# Patient Record
Sex: Male | Born: 1965
Health system: Southern US, Community
[De-identification: ages and names within clinical notes are randomized; demographics above are authoritative.]

## PROBLEM LIST (undated history)

## (undated) DIAGNOSIS — I10 Essential (primary) hypertension: Secondary | ICD-10-CM

## (undated) DIAGNOSIS — E785 Hyperlipidemia, unspecified: Secondary | ICD-10-CM

## (undated) DIAGNOSIS — F0630 Mood disorder due to known physiological condition, unspecified: Secondary | ICD-10-CM

## (undated) DIAGNOSIS — M1990 Unspecified osteoarthritis, unspecified site: Secondary | ICD-10-CM

## (undated) DIAGNOSIS — E559 Vitamin D deficiency, unspecified: Secondary | ICD-10-CM

## (undated) DIAGNOSIS — F329 Major depressive disorder, single episode, unspecified: Secondary | ICD-10-CM

## (undated) DIAGNOSIS — F32A Depression, unspecified: Secondary | ICD-10-CM

## (undated) DIAGNOSIS — R5383 Other fatigue: Secondary | ICD-10-CM

## (undated) DIAGNOSIS — E663 Overweight: Secondary | ICD-10-CM

## (undated) DIAGNOSIS — E669 Obesity, unspecified: Secondary | ICD-10-CM

## (undated) HISTORY — PX: HAND SURGERY: SHX662

## (undated) HISTORY — DX: Major depressive disorder, single episode, unspecified: F32.9

## (undated) HISTORY — DX: Hyperlipidemia, unspecified: E78.5

## (undated) HISTORY — DX: Vitamin D deficiency, unspecified: E55.9

## (undated) HISTORY — DX: Obesity, unspecified: E66.9

## (undated) HISTORY — DX: Overweight: E66.3

## (undated) HISTORY — DX: Essential (primary) hypertension: I10

## (undated) HISTORY — DX: Mood disorder due to known physiological condition, unspecified: F06.30

## (undated) HISTORY — DX: Unspecified osteoarthritis, unspecified site: M19.90

## (undated) HISTORY — DX: Depression, unspecified: F32.A

## (undated) HISTORY — DX: Other fatigue: R53.83

---

## 1993-05-07 HISTORY — PX: KNEE SURGERY: SHX244

## 2001-10-28 ENCOUNTER — Ambulatory Visit (HOSPITAL_COMMUNITY): Admission: RE | Admit: 2001-10-28 | Discharge: 2001-10-28 | Payer: Self-pay | Admitting: Orthopedic Surgery

## 2001-10-28 ENCOUNTER — Encounter: Payer: Self-pay | Admitting: Orthopedic Surgery

## 2005-11-08 ENCOUNTER — Encounter: Admission: RE | Admit: 2005-11-08 | Discharge: 2005-11-08 | Payer: Self-pay | Admitting: Orthopedic Surgery

## 2006-02-07 ENCOUNTER — Ambulatory Visit (HOSPITAL_BASED_OUTPATIENT_CLINIC_OR_DEPARTMENT_OTHER): Admission: RE | Admit: 2006-02-07 | Discharge: 2006-02-07 | Payer: Self-pay | Admitting: Orthopedic Surgery

## 2006-03-25 ENCOUNTER — Encounter: Admission: RE | Admit: 2006-03-25 | Discharge: 2006-03-25 | Payer: Self-pay | Admitting: Orthopedic Surgery

## 2011-05-08 HISTORY — PX: BACK SURGERY: SHX140

## 2015-11-09 ENCOUNTER — Ambulatory Visit (INDEPENDENT_AMBULATORY_CARE_PROVIDER_SITE_OTHER): Payer: BLUE CROSS/BLUE SHIELD | Admitting: Family Medicine

## 2015-11-09 ENCOUNTER — Encounter: Payer: Self-pay | Admitting: Family Medicine

## 2015-11-09 VITALS — BP 145/90 | HR 87 | Ht 69.25 in | Wt 198.9 lb

## 2015-11-09 DIAGNOSIS — R5383 Other fatigue: Secondary | ICD-10-CM

## 2015-11-09 DIAGNOSIS — R29898 Other symptoms and signs involving the musculoskeletal system: Secondary | ICD-10-CM | POA: Insufficient documentation

## 2015-11-09 DIAGNOSIS — E785 Hyperlipidemia, unspecified: Secondary | ICD-10-CM | POA: Insufficient documentation

## 2015-11-09 DIAGNOSIS — F39 Unspecified mood [affective] disorder: Secondary | ICD-10-CM | POA: Insufficient documentation

## 2015-11-09 DIAGNOSIS — E559 Vitamin D deficiency, unspecified: Secondary | ICD-10-CM

## 2015-11-09 DIAGNOSIS — F0630 Mood disorder due to known physiological condition, unspecified: Secondary | ICD-10-CM

## 2015-11-09 DIAGNOSIS — E663 Overweight: Secondary | ICD-10-CM

## 2015-11-09 DIAGNOSIS — I10 Essential (primary) hypertension: Secondary | ICD-10-CM

## 2015-11-09 DIAGNOSIS — E669 Obesity, unspecified: Secondary | ICD-10-CM

## 2015-11-09 DIAGNOSIS — M159 Polyosteoarthritis, unspecified: Secondary | ICD-10-CM | POA: Insufficient documentation

## 2015-11-09 DIAGNOSIS — M948X8 Other specified disorders of cartilage, other site: Secondary | ICD-10-CM

## 2015-11-09 HISTORY — DX: Overweight: E66.3

## 2015-11-09 HISTORY — DX: Hyperlipidemia, unspecified: E78.5

## 2015-11-09 HISTORY — DX: Obesity, unspecified: E66.9

## 2015-11-09 HISTORY — DX: Other fatigue: R53.83

## 2015-11-09 HISTORY — DX: Mood disorder due to known physiological condition, unspecified: F06.30

## 2015-11-09 HISTORY — DX: Vitamin D deficiency, unspecified: E55.9

## 2015-11-09 HISTORY — DX: Essential (primary) hypertension: I10

## 2015-11-09 MED ORDER — ATORVASTATIN CALCIUM 20 MG PO TABS: 20.0000 mg | ORAL_TABLET | Freq: Every day | ORAL | Status: DC

## 2015-11-09 MED ORDER — MELOXICAM 15 MG PO TABS: 15.0000 mg | ORAL_TABLET | Freq: Every day | ORAL | Status: DC

## 2015-11-09 MED ORDER — LOSARTAN POTASSIUM-HCTZ 100-12.5 MG PO TABS: 1.0000 | ORAL_TABLET | Freq: Every day | ORAL | Status: DC

## 2015-11-09 MED ORDER — SERTRALINE HCL 50 MG PO TABS: 50.0000 mg | ORAL_TABLET | Freq: Every day | ORAL | Status: DC

## 2015-11-09 NOTE — Assessment & Plan Note (Signed)
Counseling done 

## 2015-11-09 NOTE — Assessment & Plan Note (Signed)
Explained to pt that a prominent xiphoid is completely within normal limits.  Reassured patient.

## 2015-11-09 NOTE — Assessment & Plan Note (Signed)
Patient uses Mobic in the past and tolerated it well.  It worked very well for his multiple joint pains.  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  He desires refill.

## 2015-11-09 NOTE — Assessment & Plan Note (Signed)
Patient just had coffee with cream today but pt lives farther away and desires bld work today.  Prudent diet.  Discussed with patient.  Continue medications.

## 2015-11-09 NOTE — Assessment & Plan Note (Signed)
Patient understands not quite at goal of regularly under 140/90.  Advised weight loss, prudent diet, regular exercise.  Take meds daily and patient has been out of them for about 2 weeks or more, so hence I asked patient to monitor his blood pressure when he goes back on the meds.

## 2015-11-09 NOTE — Assessment & Plan Note (Signed)
Patient has not been taking his vitamin D supplements as instructed in the past to do so.  We will obtain blood work today.

## 2015-11-09 NOTE — Patient Instructions (Addendum)
Monitor BP at home and if remains above our goal of 140/90 or less--> RTC sooner than planned.    Read the tips for quitting smoking; when you're ready we can consider having you start the Chantix that you are to have at home.       Smoking Cessation, Tips for Success If you are ready to quit smoking, congratulations! You have chosen to help yourself be healthier. Cigarettes bring nicotine, tar, carbon monoxide, and other irritants into your body. Your lungs, heart, and blood vessels will be able to work better without these poisons. There are many different ways to quit smoking. Nicotine gum, nicotine patches, a nicotine inhaler, or nicotine nasal spray can help with physical craving. Hypnosis, support groups, and medicines help break the habit of smoking. WHAT THINGS CAN I DO TO MAKE QUITTING EASIER?  Here are some tips to help you quit for good:  Pick a date when you will quit smoking completely. Tell all of your friends and family about your plan to quit on that date.  Do not try to slowly cut down on the number of cigarettes you are smoking. Pick a quit date and quit smoking completely starting on that day.  Throw away all cigarettes.   Clean and remove all ashtrays from your home, work, and car.  On a card, write down your reasons for quitting. Carry the card with you and read it when you get the urge to smoke.  Cleanse your body of nicotine. Drink enough water and fluids to keep your urine clear or pale yellow. Do this after quitting to flush the nicotine from your body.  Learn to predict your moods. Do not let a bad situation be your excuse to have a cigarette. Some situations in your life might tempt you into wanting a cigarette.  Never have "just one" cigarette. It leads to wanting another and another. Remind yourself of your decision to quit.  Change habits associated with smoking. If you smoked while driving or when feeling stressed, try other activities to replace smoking.  Stand up when drinking your coffee. Brush your teeth after eating. Sit in a different chair when you read the paper. Avoid alcohol while trying to quit, and try to drink fewer caffeinated beverages. Alcohol and caffeine may urge you to smoke.  Avoid foods and drinks that can trigger a desire to smoke, such as sugary or spicy foods and alcohol.  Ask people who smoke not to smoke around you.  Have something planned to do right after eating or having a cup of coffee. For example, plan to take a walk or exercise.  Try a relaxation exercise to calm you down and decrease your stress. Remember, you may be tense and nervous for the first 2 weeks after you quit, but this will pass.  Find new activities to keep your hands busy. Play with a pen, coin, or rubber band. Doodle or draw things on paper.  Brush your teeth right after eating. This will help cut down on the craving for the taste of tobacco after meals. You can also try mouthwash.   Use oral substitutes in place of cigarettes. Try using lemon drops, carrots, cinnamon sticks, or chewing gum. Keep them handy so they are available when you have the urge to smoke.  When you have the urge to smoke, try deep breathing.  Designate your home as a nonsmoking area.  If you are a heavy smoker, ask your health care provider about a prescription for nicotine  chewing gum. It can ease your withdrawal from nicotine.  Reward yourself. Set aside the cigarette money you save and buy yourself something nice.  Look for support from others. Join a support group or smoking cessation program. Ask someone at home or at work to help you with your plan to quit smoking.  Always ask yourself, "Do I need this cigarette or is this just a reflex?" Tell yourself, "Today, I choose not to smoke," or "I do not want to smoke." You are reminding yourself of your decision to quit.  Do not replace cigarette smoking with electronic cigarettes (commonly called e-cigarettes). The  safety of e-cigarettes is unknown, and some may contain harmful chemicals.  If you relapse, do not give up! Plan ahead and think about what you will do the next time you get the urge to smoke. HOW WILL I FEEL WHEN I QUIT SMOKING? You may have symptoms of withdrawal because your body is used to nicotine (the addictive substance in cigarettes). You may crave cigarettes, be irritable, feel very hungry, cough often, get headaches, or have difficulty concentrating. The withdrawal symptoms are only temporary. They are strongest when you first quit but will go away within 10-14 days. When withdrawal symptoms occur, stay in control. Think about your reasons for quitting. Remind yourself that these are signs that your body is healing and getting used to being without cigarettes. Remember that withdrawal symptoms are easier to treat than the major diseases that smoking can cause.  Even after the withdrawal is over, expect periodic urges to smoke. However, these cravings are generally short lived and will go away whether you smoke or not. Do not smoke! WHAT RESOURCES ARE AVAILABLE TO HELP ME QUIT SMOKING? Your health care provider can direct you to community resources or hospitals for support, which may include:  Group support.  Education.  Hypnosis.  Therapy.   This information is not intended to replace advice given to you by your health care provider. Make sure you discuss any questions you have with your health care provider.   Document Released: 01/20/2004 Document Revised: 05/14/2014 Document Reviewed: 10/09/2012 Elsevier Interactive Patient Education 2016 [employer].         Steps to Quit Smoking  Smoking tobacco can be harmful to your health and can affect almost every organ in your body. Smoking puts you, and those around you, at risk for developing many serious chronic diseases. Quitting smoking is difficult, but it is one of the best things that you can do for your health. It is  never too late to quit. WHAT ARE THE BENEFITS OF QUITTING SMOKING? When you quit smoking, you lower your risk of developing serious diseases and conditions, such as:  Lung cancer or lung disease, such as COPD.  Heart disease.  Stroke.  Heart attack.  Infertility.  Osteoporosis and bone fractures. Additionally, symptoms such as coughing, wheezing, and shortness of breath may get better when you quit. You may also find that you get sick less often because your body is stronger at fighting off colds and infections. If you are pregnant, quitting smoking can help to reduce your chances of having a baby of low birth weight. HOW DO I GET READY TO QUIT? When you decide to quit smoking, create a plan to make sure that you are successful. Before you quit:  Pick a date to quit. Set a date within the next two weeks to give you time to prepare.  Write down the reasons why you are quitting.  Keep this list in places where you will see it often, such as on your bathroom mirror or in your car or wallet.  Identify the people, places, things, and activities that make you want to smoke (triggers) and avoid them. Make sure to take these actions:  Throw away all cigarettes at home, at work, and in your car.  Throw away smoking accessories, such as Scientist, research (medical).  Clean your car and make sure to empty the ashtray.  Clean your home, including curtains and carpets.  Tell your family, friends, and coworkers that you are quitting. Support from your loved ones can make quitting easier.  Talk with your health care provider about your options for quitting smoking.  Find out what treatment options are covered by your health insurance. WHAT STRATEGIES CAN I USE TO QUIT SMOKING?  Talk with your healthcare provider about different strategies to quit smoking. Some strategies include:  Quitting smoking altogether instead of gradually lessening how much you smoke over a period of time. Research shows  that quitting "cold [place]" is more successful than gradually quitting.  Attending in-person counseling to help you build problem-solving skills. You are more likely to have success in quitting if you attend several counseling sessions. Even short sessions of 10 minutes can be effective.  Finding resources and support systems that can help you to quit smoking and remain smoke-free after you quit. These resources are most helpful when you use them often. They can include:  Online chats with a Social worker.  Telephone quitlines.  Printed Furniture conservator/restorer.  Support groups or group counseling.  Text messaging programs.  Mobile phone applications.  Taking medicines to help you quit smoking. (If you are pregnant or breastfeeding, talk with your health care provider first.) Some medicines contain nicotine and some do not. Both types of medicines help with cravings, but the medicines that include nicotine help to relieve withdrawal symptoms. Your health care provider may recommend:  Nicotine patches, gum, or lozenges.  Nicotine inhalers or sprays.  Non-nicotine medicine that is taken by mouth. Talk with your health care provider about combining strategies, such as taking medicines while you are also receiving in-person counseling. Using these two strategies together makes you more likely to succeed in quitting than if you used either strategy on its own. If you are pregnant or breastfeeding, talk with your health care provider about finding counseling or other support strategies to quit smoking. Do not take medicine to help you quit smoking unless told to do so by your health care provider. WHAT THINGS CAN I DO TO MAKE IT EASIER TO QUIT? Quitting smoking might feel overwhelming at first, but there is a lot that you can do to make it easier. Take these important actions:  Reach out to your family and friends and ask that they support and encourage you during this time. Call telephone quitlines,  reach out to support groups, or work with a counselor for support.  Ask people who smoke to avoid smoking around you.  Avoid places that trigger you to smoke, such as bars, parties, or smoke-break areas at work.  Spend time around people who do not smoke.  Lessen stress in your life, because stress can be a smoking trigger for some people. To lessen stress, try:  Exercising regularly.  Deep-breathing exercises.  Yoga.  Meditating.  Performing a body scan. This involves closing your eyes, scanning your body from head to toe, and noticing which parts of your body are particularly tense. Purposefully relax  the muscles in those areas.  Download or purchase mobile phone or tablet apps (applications) that can help you stick to your quit plan by providing reminders, tips, and encouragement. There are many free apps, such as QuitGuide from the [employer] developer for Disease Control and Prevention). You can find other support for quitting smoking (smoking cessation) through smokefree.gov and other websites. HOW WILL I FEEL WHEN I QUIT SMOKING? Within the first 24 hours of quitting smoking, you may start to feel some withdrawal symptoms. These symptoms are usually most noticeable 2-3 days after quitting, but they usually do not last beyond 2-3 weeks. Changes or symptoms that you might experience include:  Mood swings.  Restlessness, anxiety, or irritation.  Difficulty concentrating.  Dizziness.  Strong cravings for sugary foods in addition to nicotine.  Mild weight gain.  Constipation.  Nausea.  Coughing or a sore throat.  Changes in how your medicines work in your body.  A depressed mood.  Difficulty sleeping (insomnia). After the first 2-3 weeks of quitting, you may start to notice more positive results, such as:  Improved sense of smell and taste.  Decreased coughing and sore throat.  Slower heart rate.  Lower blood pressure.  Clearer skin.  The ability to breathe more  easily.  Fewer sick days. Quitting smoking is very challenging for most people. Do not get discouraged if you are not successful the first time. Some people need to make many attempts to quit before they achieve long-term success. Do your best to stick to your quit plan, and talk with your health care provider if you have any questions or concerns.   This information is not intended to replace advice given to you by your health care provider. Make sure you discuss any questions you have with your health care provider.   Document Released: 04/17/2001 Document Revised: 09/07/2014 Document Reviewed: 09/07/2014 Elsevier Interactive Patient Education [employer]            Why follow it? Research shows. . Those who follow the Mediterranean diet have a reduced risk of heart disease  . The diet is associated with a reduced incidence of Parkinson's and Alzheimer's diseases . People following the diet may have longer life expectancies and lower rates of chronic diseases  . The Dietary Guidelines for Americans recommends the Mediterranean diet as an eating plan to promote health and prevent disease  What Is the Mediterranean Diet?  . Healthy eating plan based on typical foods and recipes of Mediterranean-style cooking . The diet is primarily a plant based diet; these foods should make up a majority of meals   Starches - Plant based foods should make up a majority of meals - They are an important sources of vitamins, minerals, energy, antioxidants, and fiber - Choose whole grains, foods high in fiber and minimally processed items  - Typical grain sources include wheat, oats, barley, corn, brown rice, bulgar, farro, millet, polenta, couscous  - Various types of beans include chickpeas, lentils, fava beans, black beans, white beans   Fruits  Veggies - Large quantities of antioxidant rich fruits & veggies; 6 or more servings  - Vegetables can be eaten raw or lightly drizzled with oil and  cooked  - Vegetables common to the traditional Mediterranean Diet include: artichokes, arugula, beets, broccoli, brussel sprouts, cabbage, carrots, celery, collard greens, cucumbers, eggplant, kale, leeks, lemons, lettuce, mushrooms, okra, onions, peas, peppers, potatoes, pumpkin, radishes, rutabaga, shallots, spinach, sweet potatoes, turnips, zucchini - Fruits common to the Mediterranean Diet  include: apples, apricots, avocados, cherries, clementines, dates, figs, grapefruits, grapes, melons, nectarines, oranges, peaches, pears, pomegranates, strawberries, tangerines  Fats - Replace butter and margarine with healthy oils, such as olive oil, canola oil, and tahini  - Limit nuts to no more than a handful a day  - Nuts include walnuts, almonds, pecans, pistachios, pine nuts  - Limit or avoid candied, honey roasted or heavily salted nuts - Olives are central to the [employer] - can be eaten whole or used in a variety of dishes   Meats Protein - Limiting red meat: no more than a few times a month - When eating red meat: choose lean cuts and keep the portion to the size of deck of cards - Eggs: approx. 0 to 4 times a week  - Fish and lean poultry: at least 2 a week  - Healthy protein sources include, chicken, [place], lean beef, lamb - Increase intake of seafood such as tuna, salmon, trout, mackerel, shrimp, scallops - Avoid or limit high fat processed meats such as sausage and bacon  Dairy - Include moderate amounts of low fat dairy products  - Focus on healthy dairy such as fat free yogurt, skim milk, low or reduced fat cheese - Limit dairy products higher in fat such as whole or 2% milk, cheese, ice cream  Alcohol - Moderate amounts of red wine is ok  - No more than 5 oz daily for women (all ages) and men older than age 54  - No more than 10 oz of wine daily for men younger than 89  Other - Limit sweets and other desserts  - Use herbs and spices instead of salt to flavor foods  - Herbs  and spices common to the traditional Mediterranean Diet include: basil, bay leaves, chives, cloves, cumin, fennel, garlic, lavender, marjoram, mint, oregano, parsley, pepper, rosemary, sage, savory, sumac, tarragon, thyme   It's not just a diet, it's a lifestyle:  . The Mediterranean diet includes lifestyle factors typical of those in the region  . Foods, drinks and meals are best eaten with others and savored . Daily physical activity is important for overall good health . This could be strenuous exercise like running and aerobics . This could also be more leisurely activities such as walking, housework, yard-work, or taking the stairs . Moderation is the key; a balanced and healthy diet accommodates most foods and drinks . Consider portion sizes and frequency of consumption of certain foods   Meal Ideas & Options:  . Breakfast:  o Whole wheat toast or whole wheat English muffins with peanut butter & hard boiled egg o Steel cut oats topped with apples & cinnamon and skim milk  o Fresh fruit: banana, strawberries, melon, berries, peaches  o Smoothies: strawberries, bananas, greek yogurt, peanut butter o Low fat greek yogurt with blueberries and granola  o Egg white omelet with spinach and mushrooms o Breakfast couscous: whole wheat couscous, apricots, skim milk, cranberries  . Sandwiches:  o Hummus and grilled vegetables (peppers, zucchini, squash) on whole wheat bread   o Grilled chicken on whole wheat pita with lettuce, tomatoes, cucumbers or tzatziki  o Tuna salad on whole wheat bread: tuna salad made with greek yogurt, olives, red peppers, capers, green onions o Garlic rosemary lamb pita: lamb sauted with garlic, rosemary, salt & pepper; add lettuce, cucumber, greek yogurt to pita - flavor with lemon juice and black pepper  . Seafood:  o Mediterranean grilled salmon, seasoned with garlic, basil, parsley, lemon  juice and black pepper o Shrimp, lemon, and spinach whole-grain pasta salad  made with low fat greek yogurt  o Seared scallops with lemon orzo  o Seared tuna steaks seasoned salt, pepper, coriander topped with tomato mixture of olives, tomatoes, olive oil, minced garlic, parsley, green onions and cappers  . Meats:  o Herbed greek chicken salad with kalamata olives, cucumber, feta  o Red bell peppers stuffed with spinach, bulgur, lean ground beef (or lentils) & topped with feta   o Kebabs: skewers of chicken, tomatoes, onions, zucchini, squash  o [place] burgers: made with red onions, mint, dill, lemon juice, feta cheese topped with roasted red peppers . Vegetarian o Cucumber salad: cucumbers, artichoke hearts, celery, red onion, feta cheese, tossed in olive oil & lemon juice  o Hummus and whole grain pita points with a greek salad (lettuce, tomato, feta, olives, cucumbers, red onion) o Lentil soup with celery, carrots made with vegetable broth, garlic, salt and pepper  o Tabouli salad: parsley, bulgur, mint, scallions, cucumbers, tomato, radishes, lemon juice, olive oil, salt and pepper.            DASH Eating Plan DASH stands for "Dietary Approaches to Stop Hypertension." The DASH eating plan is a healthy eating plan that has been shown to reduce high blood pressure (hypertension). Additional health benefits may include reducing the risk of type 2 diabetes mellitus, heart disease, and stroke. The DASH eating plan may also help with weight loss. WHAT DO I NEED TO KNOW ABOUT THE DASH EATING PLAN? For the DASH eating plan, you will follow these general guidelines:  Choose foods with a percent daily value for sodium of less than 5% (as listed on the food label).  Use salt-free seasonings or herbs instead of table salt or sea salt.  Check with your health care provider or pharmacist before using salt substitutes.  Eat lower-sodium products, often labeled as "lower sodium" or "no salt added."  Eat fresh foods.  Eat more vegetables, fruits, and low-fat dairy  products.  Choose whole grains. Look for the word "whole" as the first word in the ingredient list.  Choose fish and skinless chicken or [place] more often than red meat. Limit fish, poultry, and meat to 6 oz (170 g) each day.  Limit sweets, desserts, sugars, and sugary drinks.  Choose heart-healthy fats.  Limit cheese to 1 oz (28 g) per day.  Eat more home-cooked food and less restaurant, buffet, and fast food.  Limit fried foods.  Cook foods using methods other than frying.  Limit canned vegetables. If you do use them, rinse them well to decrease the sodium.  When eating at a restaurant, ask that your food be prepared with less salt, or no salt if possible. WHAT FOODS CAN I EAT? Seek help from a dietitian for individual calorie needs. Grains Whole grain or whole wheat bread. Brown rice. Whole grain or whole wheat pasta. Quinoa, bulgur, and whole grain cereals. Low-sodium cereals. Corn or whole wheat flour tortillas. Whole grain cornbread. Whole grain crackers. Low-sodium crackers. Vegetables Fresh or frozen vegetables (raw, steamed, roasted, or grilled). Low-sodium or reduced-sodium tomato and vegetable juices. Low-sodium or reduced-sodium tomato sauce and paste. Low-sodium or reduced-sodium canned vegetables.  Fruits All fresh, canned (in natural juice), or frozen fruits. Meat and Other Protein Products Ground beef (85% or leaner), grass-fed beef, or beef trimmed of fat. Skinless chicken or [place]. Ground chicken or [place]. Pork trimmed of fat. All fish and seafood. Eggs. Dried beans, peas, or  lentils. Unsalted nuts and seeds. Unsalted canned beans. Dairy Low-fat dairy products, such as skim or 1% milk, 2% or reduced-fat cheeses, low-fat ricotta or cottage cheese, or plain low-fat yogurt. Low-sodium or reduced-sodium cheeses. Fats and Oils Tub margarines without trans fats. Light or reduced-fat mayonnaise and salad dressings (reduced sodium). Avocado. Safflower, olive, or canola  oils. Natural peanut or almond butter. Other Unsalted popcorn and pretzels. The items listed above may not be a complete list of recommended foods or beverages. Contact your dietitian for more options. WHAT FOODS ARE NOT RECOMMENDED? Grains White bread. White pasta. White rice. Refined cornbread. Bagels and croissants. Crackers that contain trans fat. Vegetables Creamed or fried vegetables. Vegetables in a cheese sauce. Regular canned vegetables. Regular canned tomato sauce and paste. Regular tomato and vegetable juices. Fruits Dried fruits. Canned fruit in light or heavy syrup. Fruit juice. Meat and Other Protein Products Fatty cuts of meat. Ribs, chicken wings, bacon, sausage, bologna, salami, chitterlings, fatback, hot dogs, bratwurst, and packaged luncheon meats. Salted nuts and seeds. Canned beans with salt. Dairy Whole or 2% milk, cream, half-and-half, and cream cheese. Whole-fat or sweetened yogurt. Full-fat cheeses or blue cheese. Nondairy creamers and whipped toppings. Processed cheese, cheese spreads, or cheese curds. Condiments Onion and garlic salt, seasoned salt, table salt, and sea salt. Canned and packaged gravies. Worcestershire sauce. Tartar sauce. Barbecue sauce. Teriyaki sauce. Soy sauce, including reduced sodium. Steak sauce. Fish sauce. Oyster sauce. Cocktail sauce. Horseradish. Ketchup and mustard. Meat flavorings and tenderizers. Bouillon cubes. Hot sauce. Tabasco sauce. Marinades. Taco seasonings. Relishes. Fats and Oils Butter, stick margarine, lard, shortening, ghee, and bacon fat. Coconut, palm kernel, or palm oils. Regular salad dressings. Other Pickles and olives. Salted popcorn and pretzels. The items listed above may not be a complete list of foods and beverages to avoid. Contact your dietitian for more information. WHERE CAN I FIND MORE INFORMATION? National Heart, Lung, and Blood Institute: CableProm[employer].itwww.nhlbi.nih.gov/health/health-topics/topics/dash/   This  information is not intended to replace advice given to you by your health care provider. Make sure you discuss any questions you have with your health care provider.   Document Released: 04/12/2011 Document Revised: 05/14/2014 Document Reviewed: 02/25/2013 Elsevier Interactive Patient Education [employer] Inc.

## 2015-11-09 NOTE — Assessment & Plan Note (Signed)
Patient thinks he is more fatigued than usual due to the increased stress levels in his life lately.  We will obtain blood work though.

## 2015-11-09 NOTE — Assessment & Plan Note (Signed)
Patient is at the heaviest he has been most of his life.  He is not happy and wishes to lose weight.  We discussed 1 pound weight loss goal per week.  Cut back on portions and move more.

## 2015-11-09 NOTE — Progress Notes (Signed)
Aaron Cooper, D.O. [facility]   Subjective:    Chief Complaint  Patient presents with  . Establish Care   New pt, here to establish care.   HPI: Aaron Cooper is a pleasant 50 y.o. male who [facility] to establish care  Mood:  Been under more stress lately.  Recently he had to move into his father-in-law's house so he and his wife couldn't take care of him.  This is been stressful on the patient as they left their home and all of their goods and personal items in their home and left to their son and daughter.  Thinks he has done well since abruptly stopping his well be a trend approximately a month ago when he ran out.  No suicidal or homicidal ideation.  Does complain of decreased libido on the sertraline alone.  This is not bothersome enough to patient to stop or alter his treatment course.  He has no problems with ejaculation or orgasm.  One mo--> ran out of BP meds, his wellbutrin and mobic.  Ha snot been monitoring it at home like he is suppose to.    No exercise and no prudent diet lately  He also complains of a protuberance at the base of his sternum.  Never noticed it before, but noticed it recently.  No pain or discomfort.  No shortness of breath or difficulty breathing.  No exercise intolerance.  Still smoking.  Had quit in the past on his Wellbutrin but went back to smoking.  We discussed in detail various ways that he can quit and patient wishes to discuss it with his wife and get back to us.  Still pre-contemplative phases.     Past Medical History  Diagnosis Date  . Hyperlipidemia   . Hypertension   . Depression   . Arthritis       Past Surgical History  Procedure Laterality Date  . Hand surgery Right   . Knee surgery Right   . Back surgery        Family History  Problem Relation Age of Onset  . Diabetes Mother   . Hyperlipidemia Mother   . Heart attack Father   .  Hyperlipidemia Father   . Hypertension Father   . Cancer Sister     breast  . Stroke Maternal Aunt   . Alcohol abuse Maternal Grandmother   . Heart attack Brother   . Alcohol abuse Brother       History  Drug Use No  ,    History  Alcohol Use No  ,    History  Smoking status  . Current Every Day Smoker -- 1.50 packs/day  Smokeless tobacco  . Never Used  ,     History  Sexual Activity  . Sexual Activity: Yes      Patient's Medications  New Prescriptions   No medications on file  Previous Medications   ATORVASTATIN (LIPITOR) 20 MG TABLET    Take 20 mg by mouth daily.   HYDROCHLOROTHIAZIDE (HYDRODIURIL) 25 MG TABLET    Take 25 mg by mouth daily.   LOSARTAN (COZAAR) 100 MG TABLET    Take 100 mg by mouth daily.   SERTRALINE (ZOLOFT) 50 MG TABLET    Take 50 mg by mouth daily.  Modified Medications   No medications on file  Discontinued Medications   LOSARTAN (COZAAR) 50 MG TABLET  Take 50 mg by mouth daily.     Review of patient's allergies indicates no known allergies. Outpatient Encounter Prescriptions as of 11/09/2015  Medication Sig  . atorvastatin (LIPITOR) 20 MG tablet Take 20 mg by mouth daily.  Marland Kitchen losartan (COZAAR) 100 MG tablet Take 100 mg by mouth daily.  . sertraline (ZOLOFT) 50 MG tablet Take 50 mg by mouth daily.  . [DISCONTINUED] losartan (COZAAR) 50 MG tablet Take 50 mg by mouth daily.  . hydrochlorothiazide (HYDRODIURIL) 25 MG tablet Take 25 mg by mouth daily.   No facility-administered encounter medications on file as of 11/09/2015.      There are no preventive care reminders to display for this patient.    There is no immunization history on file for this patient.   <no information>   Fall Risk  11/09/2015 11/09/2015  Falls in the past year? No No     Depression screen [facility] 2/9 11/09/2015 11/09/2015  Decreased Interest 0 0  Down, Depressed, Hopeless 0 0  PHQ - 2 Score 0 0      Review of Systems:   ( Completed via Adult Medical  History Intake form today ) General:   Denies fever, chills, appetite changes, unexplained weight loss.  Optho/Auditory:   Denies visual changes, blurred vision/LOV, ringing in ears/ diff hearing Respiratory:   Denies SOB, DOE, cough, wheezing.  Cardiovascular:   Denies chest pain, palpitations, new onset peripheral edema  Gastrointestinal:   Denies nausea, vomiting, diarrhea.  Genitourinary:    Denies dysuria, increased frequency, flank pain.  Endocrine:     Denies hot or cold intolerance, polyuria, polydipsia. Musculoskeletal:  Denies unexplained myalgias, joint swelling, arthralgias, gait problems.  Skin:  Denies rash, suspicious lesions or new/ changes in moles Neurological:    Denies dizziness, syncope, unexplained weakness, lightheadedness, numbness  Psychiatric/Behavioral:   Denies mood changes, suicidal or homicidal ideations, hallucinations    Objective:   Blood pressure 145/90, pulse 87, height 5' 9.25" (1.759 m), weight 198 lb 14.4 oz (90.22 kg). Body mass index is 29.16 kg/(m^2).  General: Well Developed, well nourished, and in no acute distress.  Neuro: Alert and oriented x3, extra-ocular muscles intact, sensation grossly intact.  HEENT: Normocephalic, atraumatic, pupils equal round reactive to light, neck supple, no gross masses, no carotid bruits, no JVD apprec Skin: no gross suspicious lesions or rashes  Cardiac: Regular rate and rhythm, no murmurs rubs or gallops.  Respiratory: Essentially clear to auscultation bilaterally. Not using accessory muscles, speaking in full sentences.  Chest: Palpation of a more prominent xiphoid process is nontender.  No crepitus. Abdominal: Soft, not grossly distended Musculoskeletal: Ambulates w/o diff, FROM * 4 ext.  Vasc: less 2 sec cap RF, warm and pink  Psych:  No HI/SI, judgement and insight good.    Impression and Recommendations:    The patient was counselled, risk factors were discussed, anticipatory guidance  given.  Please see AVS handed out to patient at the end of our visit for further patient instructions/ counseling done pertaining to today's office visit.  Mood disorder in conditions classified elsewhere Pt has not been taking his wellbutrin for last one month or so.  Has been doing well on his sertraline-feels much calmer than usual.  We will continue that and dictated to monitor.  Encouraged healthier lifestyles of prudent diet and regular exercise.  HLD (hyperlipidemia) Patient just had coffee with cream today but pt lives farther away and desires bld work today.  Prudent diet.  Discussed with patient.  Continue medications.  HTN (hypertension) Patient understands not quite at goal of regularly under 140/90.  Advised weight loss, prudent diet, regular exercise.  Take meds daily and patient has been out of them for about 2 weeks or more, so hence I asked patient to monitor his blood pressure when he goes back on the meds.  Obesity (BMI 30-39.9) Patient is at the heaviest he has been most of his life.  He is not happy and wishes to lose weight.  We discussed 1 pound weight loss goal per week.  Cut back on portions and move more.  Vitamin D insufficiency Patient has not been taking his vitamin D supplements as instructed in the past to do so.  We will obtain blood work today.  Fatigue Patient thinks he is more fatigued than usual due to the increased stress levels in his life lately.  We will obtain blood work though.  Overweight (BMI 25.0-29.9) Counseling done  Generalized osteoarthritis of multiple sites Patient uses Mobic in the past and tolerated it well.  It worked very well for his multiple joint pains.  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  He desires refill.  Xiphoid prominence Explained to pt that a prominent xiphoid is completely within normal limits.  Reassured patient.   Gross side effects, risk and benefits, and alternatives of  medications discussed with patient.  Patient is aware that all medications have potential side effects and we are unable to predict every sideeffect or drug-drug interaction that may occur.  Expresses verbal understanding and consents to current therapy plan and treatment regiment.  Note: This document was prepared using Dragon voice recognition software and may include unintentional dictation errors.

## 2015-11-09 NOTE — Assessment & Plan Note (Addendum)
Pt has not been taking his wellbutrin for last one month or so.  Has been doing well on his sertraline-feels much calmer than usual.  We will continue that and dictated to monitor.  Encouraged healthier lifestyles of prudent diet and regular exercise.

## 2015-11-10 ENCOUNTER — Telehealth: Payer: Self-pay

## 2015-11-10 ENCOUNTER — Other Ambulatory Visit: Payer: Self-pay | Admitting: Family Medicine

## 2015-11-10 LAB — CBC
HEMATOCRIT: 47.9 % (ref 38.5–50.0)
HEMOGLOBIN: 16.3 g/dL (ref 13.2–17.1)
MCH: 30.9 pg (ref 27.0–33.0)
MCHC: 34 g/dL (ref 32.0–36.0)
MCV: 90.9 fL (ref 80.0–100.0)
MPV: 12.6 fL — ABNORMAL HIGH (ref 7.5–12.5)
Platelets: 235 10*3/uL (ref 140–400)
RBC: 5.27 MIL/uL (ref 4.20–5.80)
RDW: 13.9 % (ref 11.0–15.0)
WBC: 7.2 10*3/uL (ref 3.8–10.8)

## 2015-11-10 LAB — COMPREHENSIVE METABOLIC PANEL
ALBUMIN: 4.4 g/dL (ref 3.6–5.1)
ALK PHOS: 105 U/L (ref 40–115)
ALT: 15 U/L (ref 9–46)
AST: 15 U/L (ref 10–35)
BILIRUBIN TOTAL: 0.4 mg/dL (ref 0.2–1.2)
BUN: 16 mg/dL (ref 7–25)
CALCIUM: 9.3 mg/dL (ref 8.6–10.3)
CO2: 22 mmol/L (ref 20–31)
Chloride: 103 mmol/L (ref 98–110)
Creat: 0.72 mg/dL (ref 0.70–1.33)
GLUCOSE: 98 mg/dL (ref 65–99)
POTASSIUM: 4.7 mmol/L (ref 3.5–5.3)
Sodium: 140 mmol/L (ref 135–146)
Total Protein: 7.2 g/dL (ref 6.1–8.1)

## 2015-11-10 LAB — TSH: TSH: 0.8 mIU/L (ref 0.40–4.50)

## 2015-11-10 LAB — HEMOGLOBIN A1C
Hgb A1c MFr Bld: 5.6 % (ref <5.7)
MEAN PLASMA GLUCOSE: 114 mg/dL

## 2015-11-10 LAB — LIPID PANEL
CHOL/HDL RATIO: 6.4 ratio — AB (ref <=5.0)
CHOLESTEROL: 210 mg/dL — AB (ref 125–200)
HDL: 33 mg/dL — ABNORMAL LOW (ref >=40)
LDL Cholesterol: 124 mg/dL (ref <130)
TRIGLYCERIDES: 265 mg/dL — AB (ref <150)
VLDL: 53 mg/dL — AB (ref <30)

## 2015-11-10 LAB — VITAMIN D 25 HYDROXY (VIT D DEFICIENCY, FRACTURES): VIT D 25 HYDROXY: 22 ng/mL — AB (ref 30–100)

## 2015-11-10 LAB — VITAMIN B12: Vitamin B-12: 383 pg/mL (ref 200–1100)

## 2015-11-10 MED ORDER — ATORVASTATIN CALCIUM 80 MG PO TABS: ORAL_TABLET | ORAL | Status: DC

## 2015-11-10 MED ORDER — VITAMIN D (ERGOCALCIFEROL) 1.25 MG (50000 UNIT) PO CAPS: ORAL_CAPSULE | ORAL | Status: DC

## 2015-11-10 MED ORDER — NIACIN ER (ANTIHYPERLIPIDEMIC) 1000 MG PO TBCR: EXTENDED_RELEASE_TABLET | ORAL | Status: DC

## 2015-11-10 MED ORDER — NIACIN ER 500 MG PO CPCR: 500.0000 mg | ORAL_CAPSULE | Freq: Every day | ORAL | Status: DC

## 2015-11-10 NOTE — Telephone Encounter (Signed)
Pharmacy called requesting RX for Niacin 500mg  tablets rather than cutting a 1000mg  tablet in half.  RX sent to pharmacy.  Tiajuana Amass. Dmya Long, CMA

## 2016-02-14 ENCOUNTER — Ambulatory Visit (INDEPENDENT_AMBULATORY_CARE_PROVIDER_SITE_OTHER): Payer: BLUE CROSS/BLUE SHIELD | Admitting: Family Medicine

## 2016-02-14 ENCOUNTER — Encounter: Payer: Self-pay | Admitting: Gastroenterology

## 2016-02-14 ENCOUNTER — Encounter: Payer: Self-pay | Admitting: Family Medicine

## 2016-02-14 VITALS — BP 102/68 | HR 68 | Temp 97.7°F | Ht 69.25 in | Wt 199.0 lb

## 2016-02-14 DIAGNOSIS — I10 Essential (primary) hypertension: Secondary | ICD-10-CM

## 2016-02-14 DIAGNOSIS — Z23 Encounter for immunization: Secondary | ICD-10-CM

## 2016-02-14 DIAGNOSIS — F4323 Adjustment disorder with mixed anxiety and depressed mood: Secondary | ICD-10-CM | POA: Insufficient documentation

## 2016-02-14 DIAGNOSIS — Z1211 Encounter for screening for malignant neoplasm of colon: Secondary | ICD-10-CM

## 2016-02-14 DIAGNOSIS — M159 Polyosteoarthritis, unspecified: Secondary | ICD-10-CM

## 2016-02-14 DIAGNOSIS — F0630 Mood disorder due to known physiological condition, unspecified: Secondary | ICD-10-CM

## 2016-02-14 DIAGNOSIS — E663 Overweight: Secondary | ICD-10-CM

## 2016-02-14 DIAGNOSIS — E559 Vitamin D deficiency, unspecified: Secondary | ICD-10-CM

## 2016-02-14 MED ORDER — MELOXICAM 15 MG PO TABS: 15.0000 mg | ORAL_TABLET | Freq: Every day | ORAL | 1 refills | Status: DC

## 2016-02-14 MED ORDER — LAMOTRIGINE 25 MG PO CHEW: CHEWABLE_TABLET | ORAL | 0 refills | Status: DC

## 2016-02-14 NOTE — Progress Notes (Signed)
Impression and Recommendations:    1. Adjustment disorder with mixed anxiety and depressed mood   2. Mood disorder in conditions classified elsewhere   3. Screening for colon cancer   4. Essential hypertension   5. Vitamin D insufficiency   6. Overweight (BMI 25.0-29.9)   7. Need for prophylactic vaccination and inoculation against influenza   8. Generalized osteoarthritis of multiple sites   Pt was in the office today for 40+ minutes, with over 50% time spent in face to face counseling of various medical concerns and in coordination of care   Trial of Lamictal after discussion of the risks and benefits with patient and other alternatives. Son was on it with good results and affect.  Continue sertraline as he feels this works well for him  Referral for colonoscopy sent  Blood pressure well controlled and patient asymptomatic-continue current medications. Ambulatory monitoring. Diet and lifestyle modifications  Continue the vitamin D supplementation  Reassured patient he is not losing weight and in fact he is still in the overweight range with a BMI of over 29.  Encouraged healthy diet and exercise in hopes to gain muscle mass and lose weight  Flu vaccine given  Meloxicam refill given. Patient tolerated well and it works well   - Reminded patient he will need yearly physical with me in addition to these office visits to manage his chronic diseases.  Orders Placed This Encounter  Procedures  . Flu Vaccine QUAD 36+ mos IM  . Ambulatory referral to Gastroenterology    New Prescriptions   LAMOTRIGINE (LAMICTAL) 25 MG CHEW CHEWABLE TABLET    Blue convenience pack  (ODT) components;  blue 5 weeks starter pack contains 25 mg ODT 21 and 50 mg ODT 7    Modified Medications   Modified Medication Previous Medication   MELOXICAM (MOBIC) 15 MG TABLET meloxicam (MOBIC) 15 MG tablet      Take 1 tablet (15 mg total) by mouth daily.    Take 1 tablet (15 mg total) by mouth daily.     Discontinued Medications   NIACIN (NIASPAN) 1000 MG CR TABLET    Take 1/2 tablet nightly for 4 weeks then increase to 1 tab b-4 bedtime    The patient was counseled, risk factors were discussed, anticipatory guidance given.  Gross side effects, risk and benefits, and alternatives of medications and treatment plan in general discussed with patient.  Patient is aware that all medications have potential side effects and we are unable to predict every side effect or drug-drug interaction that may occur.   Patient will call with any questions prior to using medication if they have concerns.  Expresses verbal understanding and consents to current therapy and treatment regimen.  No barriers to understanding were identified.  Red flag symptoms and signs discussed in detail.  Patient expressed understanding regarding what to do in case of emergency\urgent symptoms   FOLLOW UP:  Return in about 4 weeks (around 03/13/2016) for Recheck mood and starting Lamictal, recheck LFT.    Please see AVS handed out to patient at the end of our visit for further patient instructions/ counseling done pertaining to today's office visit.    Note: This document was prepared using Dragon voice recognition software and may include unintentional dictation errors.   -------------------------------------------------------------------------------------------------------------------------------------------------------------------------------------------------------------------------------------------- Subjective:    CC:  Chief Complaint  Patient presents with  . Hypertension  . Hyperlipidemia    HPI: Aaron Cooper is a 50 y.o. male who presents to  [place] [facility] today for issues as discussed below.   Hypertension-Even though blood pressures a little low today- patient feels great.  No dizziness, tiredness etc.  In the past several years his blood pressure usually runs on the low normal  side and he is completely asymptomatic.     Mood: He is on the sertraline that we put them on in the past. He is doing okay but still having outbursts of anger- about 3-4 times a day at work where he feels a bit out-of-control and it is causing interpersonal strains with his coworkers. Patient is not happy and feels like he needs some kind of a mood stabilizer to keep him more on a even keel.  He does have a family history of bipolar- and in his younger son.   It is finally hitting him, and he is coming to the realization that he left his whole house and everything he owns to his children and all just to move in with his father in law with his wife. This is emotionally distressing to him but he feels he needs to support his wife.   Hyperlipidemia- Tolerated the Niaspan well that we added last office visit as well as the increase in Lipitor.    He is trying to drink more water. Still not the best diet- a lot of fast foods. No exercise at all   He is also concerned that he has lost "a bunch of weight in the past couple of months ".   His wife has been on him a lot about it.   Patient has not been changing his diet or activity level. He is worried something is wrong.    Review of the chart shows that he has actually gained 1 pound in 3+ months since last seen. Patient was shocked, yet relieved by this.   Wt Readings from Last 3 Encounters:  02/14/16 199 lb (90.3 kg)  11/09/15 198 lb 14.4 oz (90.2 kg)   BP Readings from Last 3 Encounters:  02/14/16 102/68  11/09/15 (!) 145/90   Pulse Readings from Last 3 Encounters:  02/14/16 68  11/09/15 87   BMI Readings from Last 3 Encounters:  02/14/16 29.18 kg/m  11/09/15 29.16 kg/m     Patient Care Team    Relationship Specialty Notifications Start End  Thomasene Lot, DO PCP - General Family Medicine  11/09/15      Patient Active Problem List   Diagnosis Date Noted  . Adjustment disorder with mixed anxiety and depressed mood 02/14/2016    . HTN (hypertension) 11/09/2015  . Mood disorder in conditions classified elsewhere 11/09/2015  . HLD (hyperlipidemia) 11/09/2015  . Vitamin D insufficiency 11/09/2015  . Fatigue 11/09/2015  . Overweight (BMI 25.0-29.9) 11/09/2015  . Generalized osteoarthritis of multiple sites 11/09/2015  . Xiphoid prominence 11/09/2015    Current Outpatient Prescriptions on File Prior to Visit  Medication Sig Dispense Refill  . atorvastatin (LIPITOR) 80 MG tablet Take 80 mg nightly before bedtime. 90 tablet 3  . losartan-hydrochlorothiazide (HYZAAR) 100-12.5 MG tablet Take 1 tablet by mouth daily. 90 tablet 1  . sertraline (ZOLOFT) 50 MG tablet Take 1 tablet (50 mg total) by mouth daily. 90 tablet 1  . Vitamin D, Ergocalciferol, (DRISDOL) 50000 units CAPS capsule Take one tablet wkly 12 capsule 10  . niacin 500 MG CR capsule Take 1 capsule (500 mg total) by mouth at bedtime. (Patient not taking: Reported on 02/14/2016) 28 capsule 0   No  current facility-administered medications on file prior to visit.     Past Medical history, Surgical history, Family history, Social history, Allergies and Medications have been entered into the medical record, reviewed and changed as needed.   Allergies:  No Known Allergies  Review of Systems  Constitutional: Negative.  Negative for chills, diaphoresis, fever, malaise/fatigue and weight loss.  HENT: Negative.  Negative for congestion, sore throat and tinnitus.   Eyes: Negative.  Negative for blurred vision, double vision and photophobia.  Respiratory: Negative.  Negative for cough and wheezing.   Cardiovascular: Negative.  Negative for chest pain and palpitations.  Gastrointestinal: Negative.  Negative for blood in stool, diarrhea, nausea and vomiting.  Genitourinary: Negative.  Negative for dysuria, frequency and urgency.  Musculoskeletal: Positive for back pain and joint pain. Negative for myalgias.       Bilateral knee pain  Skin: Negative.  Negative for  itching and rash.  Neurological: Negative.  Negative for dizziness, focal weakness, weakness and headaches.  Endo/Heme/Allergies: Negative.  Negative for environmental allergies and polydipsia. Does not bruise/bleed easily.  Psychiatric/Behavioral: Negative.  Negative for depression and memory loss. The patient is not nervous/anxious and does not have insomnia.      Objective:   Blood pressure 102/68, pulse 68, temperature 97.7 F (36.5 C), temperature source Oral, height 5' 9.25" (1.759 m), weight 199 lb (90.3 kg). Body mass index is 29.18 kg/m. General: Well Developed, well nourished, appropriate for stated age.  Neuro: Alert and oriented x3, extra-ocular muscles intact, sensation grossly intact.  HEENT: Normocephalic, atraumatic, neck supple,  no carotid bruits, no JVD  Skin: Warm and dry, no gross rash. Cardiac: RRR, S1 S2,  no murmurs rubs or gallops.  Respiratory: ECTA B/L, Not using accessory muscles, speaking in full sentences-unlabored. Vascular:  No gross lower ext edema, cap RF less 2 sec. Psych: No HI/SI, judgement and insight good, Euthymic mood. Full Affect.

## 2016-02-14 NOTE — Patient Instructions (Signed)
  Adjustment Disorder Adjustment disorder is an unusually severe reaction to a stressful life event, such as the loss of a job or physical illness. The event may be any stressful event other than the loss of a loved one. Adjustment disorder may affect your feelings, your thinking, how you act, or a combination of these. It may interfere with personal relationships or with the way you are at work, school, or home. People with this disorder are at risk for suicide and substance abuse. They may develop a more serious mental disorder, such as major depressive disorder or post-traumatic stress disorder. SIGNS AND SYMPTOMS  Symptoms may include:  Sadness, depressed mood, or crying spells.  Loss of enjoyment.  Change in appetite or weight.  Sense of loss or hopelessness.  Thoughts of suicide.  Anxiety, worry, or nervousness.  Trouble sleeping.  Avoiding family and friends.  Poor school performance.  Fighting or vandalism.  Reckless driving.  Skipping school.  Poor work performance.  Ignoring bills. Symptoms of adjustment disorder start within 3 months of the stressful life event. They do not last more than 6 months after the event has ended. DIAGNOSIS  To make a diagnosis, your health care provider will ask about what has happened in your life and how it has affected you. He or she may also ask about your medical history and use of medicines, alcohol, and other substances. Your health care provider may do a physical exam and order lab tests or other studies. You may be referred to a mental health specialist for evaluation. TREATMENT  Treatment options include:  Counseling or talk therapy. Talk therapy is usually provided by mental health specialists.  Medicine. Certain medicines may help with depression, anxiety, and sleep.  Support groups. Support groups offer emotional support, advice, and guidance. They are made up of people who have had similar experiences. HOME CARE  INSTRUCTIONS  Keep all follow-up visits as directed by your health care provider. This is important.  Take medicines only as directed by your health care provider. SEEK MEDICAL CARE IF:  Your symptoms get worse.  SEEK IMMEDIATE MEDICAL CARE IF: You have serious thoughts about hurting yourself or someone else. MAKE SURE YOU:  Understand these instructions.  Will watch your condition.  Will get help right away if you are not doing well or get worse.   This information is not intended to replace advice given to you by your health care provider. Make sure you discuss any questions you have with your health care provider.   Document Released: 12/26/2005 Document Revised: 05/14/2014 Document Reviewed: 09/15/2013 Elsevier Interactive Patient Education 2016 [employer]  

## 2016-03-20 ENCOUNTER — Ambulatory Visit (INDEPENDENT_AMBULATORY_CARE_PROVIDER_SITE_OTHER): Payer: BLUE CROSS/BLUE SHIELD | Admitting: Family Medicine

## 2016-03-20 ENCOUNTER — Encounter: Payer: Self-pay | Admitting: Family Medicine

## 2016-03-20 VITALS — BP 110/71 | HR 63 | Ht 69.25 in | Wt 206.0 lb

## 2016-03-20 DIAGNOSIS — F0630 Mood disorder due to known physiological condition, unspecified: Secondary | ICD-10-CM

## 2016-03-20 DIAGNOSIS — E559 Vitamin D deficiency, unspecified: Secondary | ICD-10-CM | POA: Diagnosis not present

## 2016-03-20 DIAGNOSIS — I10 Essential (primary) hypertension: Secondary | ICD-10-CM | POA: Diagnosis not present

## 2016-03-20 DIAGNOSIS — E782 Mixed hyperlipidemia: Secondary | ICD-10-CM

## 2016-03-20 DIAGNOSIS — F4323 Adjustment disorder with mixed anxiety and depressed mood: Secondary | ICD-10-CM | POA: Diagnosis not present

## 2016-03-20 DIAGNOSIS — E669 Obesity, unspecified: Secondary | ICD-10-CM

## 2016-03-20 MED ORDER — LAMOTRIGINE 100 MG PO TABS: 50.0000 mg | ORAL_TABLET | Freq: Two times a day (BID) | ORAL | 1 refills | Status: DC

## 2016-03-20 NOTE — Assessment & Plan Note (Signed)
Counseling done- advised wt loss.  Discussed with patient importance of weight loss to help achieve health goals and how increasing weight, correlates to increasing risk of disease. (or increasing risk of not controlling existing diseases.) 

## 2016-03-20 NOTE — Assessment & Plan Note (Deleted)
Continue lamictal and zoloft - current dose. 

## 2016-03-20 NOTE — Patient Instructions (Signed)
Please if the 50 mg of lamotrigine once daily works fine, just continue that. No need to go to twice a day if the 1 time daily is working well.

## 2016-03-20 NOTE — Progress Notes (Signed)
Impression and Recommendations:    1. Adjustment disorder with mixed anxiety and depressed mood   2. Essential hypertension   3. Mixed hyperlipidemia   4. Vitamin D insufficiency   5. Mood disorder in conditions classified elsewhere   6. Obesity (BMI 30-39.9)     HLD (hyperlipidemia) Check CMP, no complaints, cont niacin and lipitor  Mood disorder in conditions classified elsewhere Continue lamictal and zoloft - current dose.  Obesity (BMI 30-39.9) Counseling done- advised wt loss.  Discussed with patient importance of weight loss to help achieve health goals and how increasing weight, correlates to increasing risk of disease. (or increasing risk of not controlling existing diseases.)  HTN (hypertension) Well controlled - cont meds, check CMP   New Prescriptions   LAMOTRIGINE (LAMICTAL) 100 MG TABLET    Take 0.5 tablets (50 mg total) by mouth 2 (two) times daily.    Modified Medications   No medications on file    Discontinued Medications   LAMOTRIGINE (LAMICTAL) 25 MG CHEW CHEWABLE TABLET    Blue convenience pack  (ODT) components;  blue 5 weeks starter pack contains 25 mg ODT 21 and 50 mg ODT 7    The patient was counseled, risk factors were discussed, anticipatory guidance given.  Gross side effects, risk and benefits, and alternatives of medications and treatment plan in general discussed with patient.  Patient is aware that all medications have potential side effects and we are unable to predict every side effect or drug-drug interaction that may occur.   Patient will call with any questions prior to using medication if they have concerns.  Expresses verbal understanding and consents to current therapy and treatment regimen.  No barriers to understanding were identified.  Red flag symptoms and signs discussed in detail.  Patient expressed understanding regarding what to do in case of emergency\urgent symptoms  Return in about 4 months (around 07/18/2016) for  Follow-up of current medical issues.  Please see AVS handed out to patient at the end of our visit for further patient instructions/ counseling done pertaining to today's office visit.    Note: This document was prepared using Dragon voice recognition software and may include unintentional dictation errors.   --------------------------------------------------------------------------------------------------------------------------------------------------------------------------------------------------------------------------------------------    Subjective:    CC: No chief complaint on file.   HPI: Aaron Cooper is a 50 y.o. male who [facility] today for issues as discussed below.    mood- much more even kneel since starting the lamictal last OV on 02-14-16.   Doesn't feel like he wants to "rip people's heads off".   Much calmer with family / wife  Still smoking, not ready to quit though.    Went to GI for c/s re: colonoscopy.  Scheduled for early Dec.      Wt Readings from Last 3 Encounters:  03/20/16 206 lb (93.4 kg)  02/14/16 199 lb (90.3 kg)  11/09/15 198 lb 14.4 oz (90.2 kg)   BP Readings from Last 3 Encounters:  03/20/16 110/71  02/14/16 102/68  11/09/15 (!) 145/90   Pulse Readings from Last 3 Encounters:  03/20/16 63  02/14/16 68  11/09/15 87   BMI Readings from Last 3 Encounters:  03/20/16 30.20 kg/m  02/14/16 29.18 kg/m  11/09/15 29.16 kg/m     Patient Care Team    Relationship Specialty Notifications Start End  Thomasene Loteborah Joevanni Roddey, DO PCP - General Family Medicine  11/09/15     Patient Active Problem  List   Diagnosis Date Noted  . Adjustment disorder with mixed anxiety and depressed mood 02/14/2016  . HTN (hypertension) 11/09/2015  . Mood disorder in conditions classified elsewhere 11/09/2015  . HLD (hyperlipidemia) 11/09/2015  . Vitamin D insufficiency 11/09/2015  . Fatigue 11/09/2015  . Generalized  osteoarthritis of multiple sites 11/09/2015  . 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 Obesity (BMI 30-39.9) 11/09/2015    Past Medical history, Surgical history, Family history, Social history, Allergies and Medications have been entered into the medical record, reviewed and changed as needed.   Allergies:  No Known Allergies  Review of Systems  Constitutional: Negative.  Negative for chills, diaphoresis, fever, malaise/fatigue and weight loss.  HENT: Negative.  Negative for congestion, sore throat and tinnitus.   Eyes: Negative.  Negative for blurred vision, double vision and photophobia.  Respiratory: Negative.  Negative for cough and wheezing.   Cardiovascular: Negative.  Negative for chest pain and palpitations.  Gastrointestinal: Negative.  Negative for blood in stool, diarrhea, nausea and vomiting.  Genitourinary: Negative.  Negative for dysuria, frequency and urgency.  Musculoskeletal: Negative.  Negative for joint pain and myalgias.  Skin: Negative.  Negative for itching and rash.  Neurological: Negative.  Negative for dizziness, focal weakness, weakness and headaches.  Endo/Heme/Allergies: Negative.  Negative for environmental allergies and polydipsia. Does not bruise/bleed easily.  Psychiatric/Behavioral: Negative.  Negative for depression and memory loss. The patient is not nervous/anxious and does not have insomnia.      Objective:   Blood pressure 110/71, pulse 63, height 5' 9.25" (1.759 m), weight 206 lb (93.4 kg). Body mass index is 30.2 kg/m. General: Well Developed, well nourished, appropriate for stated age.  Neuro: Alert and oriented x3, extra-ocular muscles intact, sensation grossly intact.  HEENT: Normocephalic, atraumatic, neck supple   Skin: Warm and dry, no gross rash. Cardiac: RRR, S1 S2,  no murmurs rubs or gallops.  Respiratory: ECTA B/L, Not using accessory muscles, speaking in full sentences-unlabored. Vascular:  No gross lower ext edema, cap RF less 2  sec. Psych: No HI/SI, judgement and insight good, Euthymic mood. Full Affect.

## 2016-03-20 NOTE — Assessment & Plan Note (Signed)
Continue lamictal and zoloft - current dose.

## 2016-03-20 NOTE — Assessment & Plan Note (Signed)
Check CMP, no complaints, cont niacin and lipitor

## 2016-03-20 NOTE — Assessment & Plan Note (Signed)
Well controlled - cont meds, check CMP

## 2016-03-21 LAB — LIPID PANEL
CHOL/HDL RATIO: 4.2 ratio (ref <5.0)
CHOLESTEROL: 156 mg/dL (ref <200)
HDL: 37 mg/dL — ABNORMAL LOW (ref >40)
LDL Cholesterol: 85 mg/dL (ref <100)
TRIGLYCERIDES: 170 mg/dL — AB (ref <150)
VLDL: 34 mg/dL — AB (ref <30)

## 2016-03-21 LAB — COMPLETE METABOLIC PANEL WITH GFR
ALBUMIN: 4.4 g/dL (ref 3.6–5.1)
ALK PHOS: 93 U/L (ref 40–115)
ALT: 21 U/L (ref 9–46)
AST: 18 U/L (ref 10–35)
BUN: 18 mg/dL (ref 7–25)
CALCIUM: 9.3 mg/dL (ref 8.6–10.3)
CHLORIDE: 100 mmol/L (ref 98–110)
CO2: 29 mmol/L (ref 20–31)
Creat: 0.84 mg/dL (ref 0.70–1.33)
GFR, Est Non African American: >89 mL/min (ref >=60)
Glucose, Bld: 86 mg/dL (ref 65–99)
POTASSIUM: 4.3 mmol/L (ref 3.5–5.3)
Sodium: 137 mmol/L (ref 135–146)
Total Bilirubin: 0.6 mg/dL (ref 0.2–1.2)
Total Protein: 7 g/dL (ref 6.1–8.1)

## 2016-04-16 ENCOUNTER — Encounter: Payer: Self-pay | Admitting: Gastroenterology

## 2016-05-03 ENCOUNTER — Encounter: Payer: Self-pay | Admitting: Family Medicine

## 2016-05-15 ENCOUNTER — Other Ambulatory Visit: Payer: Self-pay

## 2016-05-15 MED ORDER — LOSARTAN POTASSIUM-HCTZ 100-12.5 MG PO TABS: 1.0000 | ORAL_TABLET | Freq: Every day | ORAL | 1 refills | Status: DC

## 2016-10-03 ENCOUNTER — Other Ambulatory Visit: Payer: Self-pay

## 2016-10-03 MED ORDER — LAMOTRIGINE 100 MG PO TABS: 50.0000 mg | ORAL_TABLET | Freq: Two times a day (BID) | ORAL | 0 refills | Status: DC

## 2016-10-03 MED ORDER — MELOXICAM 15 MG PO TABS: 15.0000 mg | ORAL_TABLET | Freq: Every day | ORAL | 0 refills | Status: DC

## 2016-10-03 NOTE — Telephone Encounter (Signed)
Lamotrigine and meloxicam refilled.  Patient needs appointment for further refills.

## 2016-11-12 ENCOUNTER — Other Ambulatory Visit: Payer: Self-pay

## 2016-11-12 DIAGNOSIS — F4323 Adjustment disorder with mixed anxiety and depressed mood: Secondary | ICD-10-CM

## 2016-11-12 DIAGNOSIS — F0630 Mood disorder due to known physiological condition, unspecified: Secondary | ICD-10-CM

## 2016-11-12 MED ORDER — LAMOTRIGINE 100 MG PO TABS: 50.0000 mg | ORAL_TABLET | Freq: Two times a day (BID) | ORAL | 0 refills | Status: DC

## 2016-11-12 NOTE — Telephone Encounter (Signed)
Pharmacy sent over refill request for Lamotrigine - per note on last refill patient needs follow up.  Patient has not made that follow up appointment.  Refilled for 15 days patient will need to make appointment for follow up. MPulliam, CMA/RT(R)

## 2016-11-20 ENCOUNTER — Encounter: Payer: Self-pay | Admitting: Family Medicine

## 2016-11-20 ENCOUNTER — Ambulatory Visit: Payer: BLUE CROSS/BLUE SHIELD | Admitting: Family Medicine

## 2016-11-20 VITALS — BP 126/77 | HR 77 | Ht 69.25 in | Wt 203.0 lb

## 2016-11-20 DIAGNOSIS — E559 Vitamin D deficiency, unspecified: Secondary | ICD-10-CM

## 2016-11-20 DIAGNOSIS — Z716 Tobacco abuse counseling: Secondary | ICD-10-CM

## 2016-11-20 DIAGNOSIS — M159 Polyosteoarthritis, unspecified: Secondary | ICD-10-CM

## 2016-11-20 DIAGNOSIS — F0630 Mood disorder due to known physiological condition, unspecified: Secondary | ICD-10-CM

## 2016-11-20 DIAGNOSIS — R5383 Other fatigue: Secondary | ICD-10-CM

## 2016-11-20 DIAGNOSIS — E781 Pure hyperglyceridemia: Secondary | ICD-10-CM

## 2016-11-20 DIAGNOSIS — Z9229 Personal history of other drug therapy: Secondary | ICD-10-CM

## 2016-11-20 DIAGNOSIS — E782 Mixed hyperlipidemia: Secondary | ICD-10-CM

## 2016-11-20 DIAGNOSIS — Z114 Encounter for screening for human immunodeficiency virus [HIV]: Secondary | ICD-10-CM

## 2016-11-20 DIAGNOSIS — Z720 Tobacco use: Secondary | ICD-10-CM

## 2016-11-20 DIAGNOSIS — F4323 Adjustment disorder with mixed anxiety and depressed mood: Secondary | ICD-10-CM

## 2016-11-20 DIAGNOSIS — I10 Essential (primary) hypertension: Secondary | ICD-10-CM

## 2016-11-20 DIAGNOSIS — Z789 Other specified health status: Secondary | ICD-10-CM

## 2016-11-20 MED ORDER — MELOXICAM 15 MG PO TABS: 15.0000 mg | ORAL_TABLET | Freq: Every day | ORAL | 0 refills | Status: DC

## 2016-11-20 MED ORDER — LAMOTRIGINE 100 MG PO TABS: 50.0000 mg | ORAL_TABLET | Freq: Two times a day (BID) | ORAL | 0 refills | Status: DC

## 2016-11-20 MED ORDER — NIACIN ER 500 MG PO CPCR: 500.0000 mg | ORAL_CAPSULE | Freq: Two times a day (BID) | ORAL | 1 refills | Status: DC

## 2016-11-20 MED ORDER — LOSARTAN POTASSIUM-HCTZ 100-12.5 MG PO TABS: 1.0000 | ORAL_TABLET | Freq: Every day | ORAL | 1 refills | Status: DC

## 2016-11-20 MED ORDER — NIACIN ER 500 MG PO CPCR: 500.0000 mg | ORAL_CAPSULE | Freq: Every day | ORAL | 0 refills | Status: DC

## 2016-11-20 MED ORDER — ATORVASTATIN CALCIUM 80 MG PO TABS: ORAL_TABLET | ORAL | 3 refills | Status: DC

## 2016-11-20 MED ORDER — SERTRALINE HCL 50 MG PO TABS: 50.0000 mg | ORAL_TABLET | Freq: Every day | ORAL | 1 refills | Status: DC

## 2016-11-20 MED ORDER — MELOXICAM 15 MG PO TABS: 15.0000 mg | ORAL_TABLET | Freq: Every day | ORAL | 3 refills | Status: DC

## 2016-11-20 NOTE — Progress Notes (Signed)
Impression and Recommendations:    1. Essential hypertension   2. Generalized osteoarthritis of multiple sites   3. Tetanus toxoid vaccination administered greater than 10 years ago   4. Screening for HIV (human immunodeficiency virus)   5. Tobacco abuse counseling   6. Tobacco abuse   7. Mixed hyperlipidemia   8. Mood disorder in conditions classified elsewhere   9. Adjustment disorder with mixed anxiety and depressed mood   10. Vitamin D insufficiency   11. Other fatigue   12. Hypertriglyceridemia     No problem-specific Assessment & Plan notes found for this encounter.   New Prescriptions   No medications on file    Modified Medications   Modified Medication Previous Medication   ATORVASTATIN (LIPITOR) 80 MG TABLET atorvastatin (LIPITOR) 80 MG tablet      Take 80 mg nightly before bedtime.    Take 80 mg nightly before bedtime.   LAMOTRIGINE (LAMICTAL) 100 MG TABLET lamoTRIgine (LAMICTAL) 100 MG tablet      Take 0.5 tablets (50 mg total) by mouth 2 (two) times daily.    Take 0.5 tablets (50 mg total) by mouth 2 (two) times daily.   LOSARTAN-HYDROCHLOROTHIAZIDE (HYZAAR) 100-12.5 MG TABLET losartan-hydrochlorothiazide (HYZAAR) 100-12.5 MG tablet      Take 1 tablet by mouth daily.    Take 1 tablet by mouth daily.   MELOXICAM (MOBIC) 15 MG TABLET meloxicam (MOBIC) 15 MG tablet      Take 1 tablet (15 mg total) by mouth daily.    Take 1 tablet (15 mg total) by mouth daily.   NIACIN 500 MG CR CAPSULE niacin 500 MG CR capsule      Take 1 capsule (500 mg total) by mouth 2 (two) times daily with a meal.    Take 1 capsule (500 mg total) by mouth at bedtime.   SERTRALINE (ZOLOFT) 50 MG TABLET sertraline (ZOLOFT) 50 MG tablet      Take 1 tablet (50 mg total) by mouth daily.    Take 1 tablet (50 mg total) by mouth daily.    Discontinued Medications   No medications on file    The patient was counseled, risk factors were discussed, anticipatory guidance given.  Gross side  effects, risk and benefits, and alternatives of medications and treatment plan in general discussed with patient.  Patient is aware that all medications have potential side effects and we are unable to predict every side effect or drug-drug interaction that may occur.   Patient will call with any questions prior to using medication if they have concerns.  Expresses verbal understanding and consents to current therapy and treatment regimen.  No barriers to understanding were identified.  Red flag symptoms and signs discussed in detail.  Patient expressed understanding regarding what to do in case of emergency\urgent symptoms  No Follow-up on file.  Please see AVS handed out to patient at the end of our visit for further patient instructions/ counseling done pertaining to today's office visit.    Note: This document was prepared using Dragon voice recognition software and may include unintentional dictation errors.   --------------------------------------------------------------------------------------------------------------------------------------------------------------------------------------------------------------------------------------------    Subjective:    CC:  Chief Complaint  Patient presents with  . Follow-up  . Hypertension  . Hyperlipidemia    HPI: Aaron Cooper is a 51 y.o. male who [facility] today for issues as discussed below.    mood- Much more even keel since being on  the Lamictal back in October 2017.  He is only taking 50 mg once in the evening before bedtime.  Motor is much more stable  Doesn't feel like he wants to "rip people's heads off".   Much calmer with family / wife.    Still smoking, not ready to quit though.    Hasn't gone for GI colonoscopy c/s yet.  He will call to reschedule that.   hypertension: Well-controlled, patient is completely asymptomatic.  Tolerating blood pressure meds well   Wt Readings  from Last 3 Encounters:  11/20/16 203 lb (92.1 kg)  03/20/16 206 lb (93.4 kg)  02/14/16 199 lb (90.3 kg)   BP Readings from Last 3 Encounters:  11/20/16 126/77  03/20/16 110/71  02/14/16 102/68   Pulse Readings from Last 3 Encounters:  11/20/16 77  03/20/16 63  02/14/16 68   BMI Readings from Last 3 Encounters:  11/20/16 29.76 kg/m  03/20/16 30.20 kg/m  02/14/16 29.18 kg/m     Patient Care Team    Relationship Specialty Notifications Start End  Thomasene Lot, DO PCP - General Family Medicine  11/09/15     Patient Active Problem List   Diagnosis Date Noted  . Tobacco abuse 11/20/2016  . Tobacco abuse counseling 11/20/2016  . Adjustment disorder with mixed anxiety and depressed mood 02/14/2016  . HTN (hypertension) 11/09/2015  . Mood disorder in conditions classified elsewhere 11/09/2015  . HLD (hyperlipidemia) 11/09/2015  . Vitamin D insufficiency 11/09/2015  . Fatigue 11/09/2015  . Generalized osteoarthritis of multiple sites 11/09/2015  . 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 Obesity (BMI 30-39.9) 11/09/2015    Past Medical history, Surgical history, Family history, Social history, Allergies and Medications have been entered into the medical record, reviewed and changed as needed.   Allergies:  No Known Allergies  Review of Systems  Constitutional: Negative.  Negative for chills, diaphoresis, fever, malaise/fatigue and weight loss.  HENT: Negative.  Negative for congestion, sore throat and tinnitus.   Eyes: Negative.  Negative for blurred vision, double vision and photophobia.  Respiratory: Negative.  Negative for cough and wheezing.   Cardiovascular: Negative.  Negative for chest pain and palpitations.  Gastrointestinal: Negative.  Negative for blood in stool, diarrhea, nausea and vomiting.  Genitourinary: Negative.  Negative for dysuria, frequency and urgency.  Musculoskeletal: Negative.  Negative for joint pain and myalgias.  Skin: Negative.  Negative for  itching and rash.  Neurological: Negative.  Negative for dizziness, focal weakness, weakness and headaches.  Endo/Heme/Allergies: Negative.  Negative for environmental allergies and polydipsia. Does not bruise/bleed easily.  Psychiatric/Behavioral: Negative.  Negative for depression and memory loss. The patient is not nervous/anxious and does not have insomnia.      Objective:   Blood pressure 126/77, pulse 77, height 5' 9.25" (1.759 m), weight 203 lb (92.1 kg). Body mass index is 29.76 kg/m. General: Well Developed, well nourished, appropriate for stated age.  Neuro: Alert and oriented x3, extra-ocular muscles intact, sensation grossly intact.  HEENT: Normocephalic, atraumatic, neck supple   Skin: Warm and dry, no gross rash. Cardiac: RRR, S1 S2,  no murmurs rubs or gallops.  Respiratory: ECTA B/L, Not using accessory muscles, speaking in full sentences-unlabored. Vascular:  No gross lower ext edema, cap RF less 2 sec. Psych: No HI/SI, judgement and insight good, Euthymic mood. Full Affect.

## 2016-11-20 NOTE — Patient Instructions (Addendum)
Joselyn Glassmanyler or Dondra SpryGail can you please give Aaron Cooper the name and phone number of the gastroenterologist we referred him to for colonoscopy so he can call and reschedule that appointment?  thank you.   - Please make follow-up in the future for your knees if you like me to get x-rays and possibly inject them.

## 2016-12-10 ENCOUNTER — Ambulatory Visit (INDEPENDENT_AMBULATORY_CARE_PROVIDER_SITE_OTHER): Payer: BLUE CROSS/BLUE SHIELD | Admitting: Family Medicine

## 2016-12-10 ENCOUNTER — Ambulatory Visit: Payer: BLUE CROSS/BLUE SHIELD

## 2016-12-10 ENCOUNTER — Encounter: Payer: Self-pay | Admitting: Family Medicine

## 2016-12-10 VITALS — BP 126/81 | HR 85 | Ht 69.25 in | Wt 196.3 lb

## 2016-12-10 DIAGNOSIS — Z23 Encounter for immunization: Secondary | ICD-10-CM | POA: Diagnosis not present

## 2016-12-10 DIAGNOSIS — M25561 Pain in right knee: Secondary | ICD-10-CM

## 2016-12-10 DIAGNOSIS — M25562 Pain in left knee: Secondary | ICD-10-CM

## 2016-12-10 DIAGNOSIS — Z789 Other specified health status: Secondary | ICD-10-CM | POA: Diagnosis not present

## 2016-12-10 NOTE — Patient Instructions (Signed)
Please ice her knee for 15-20 minutes 3-4 times a day for especially over the next 48 hours.  Please let me know if you have any fever chills increasing swelling and redness etc. of the knee.  Let me know immediately if this is the case.     Knee Pain, Adult Knee pain in adults is common. It can be caused by many things, including:  Arthritis.  A fluid-filled sac (cyst) or growth in your knee.  An infection in your knee.  An injury that will not heal.  Damage, swelling, or irritation of the tissues that support your knee.  Knee pain is usually not a sign of a serious problem. The pain may go away on its own with time and rest. If it does not, a health care provider may order tests to find the cause of the pain. These may include:  Imaging tests, such as an X-ray, MRI, or ultrasound.  Joint aspiration. In this test, fluid is removed from the knee.  Arthroscopy. In this test, a lighted tube is inserted into knee and an image is projected onto a TV screen.  A biopsy. In this test, a sample of tissue is removed from the body and studied under a microscope.  Follow these instructions at home: Pay attention to any changes in your symptoms. Take these actions to relieve your pain. Activity  Rest your knee.  Do not do things that cause pain or make pain worse.  Avoid high-impact activities or exercises, such as running, jumping rope, or doing jumping jacks. General instructions  Take over-the-counter and prescription medicines only as told by your health care provider.  Raise (elevate) your knee above the level of your heart when you are sitting or lying down.  Sleep with a pillow under your knee.  If directed, apply ice to the knee: ? Put ice in a plastic bag. ? Place a towel between your skin and the bag. ? Leave the ice on for 20 minutes, 2-3 times a day.  Ask your health care provider if you should wear an elastic knee support.  Lose weight if you are overweight. Extra  weight can put pressure on your knee.  Do not use any products that contain nicotine or tobacco, such as cigarettes and e-cigarettes. Smoking may slow the healing of any bone and joint problems that you may have. If you need help quitting, ask your health care provider. Contact a health care provider if:  Your knee pain continues, changes, or gets worse.  You have a fever along with knee pain.  Your knee buckles or locks up.  Your knee swells, and the swelling becomes worse. Get help right away if:  Your knee feels warm to the touch.  You cannot move your knee.  You have severe pain in your knee.  You have chest pain.  You have trouble breathing. Summary  Knee pain in adults is common. It can be caused by many things, including, arthritis, infection, cysts, or injury.  Knee pain is usually not a sign of a serious problem, but if it does not go away, a health care provider may perform tests to know the cause of the pain.  Pay attention to any changes in your symptoms. Relieve your pain with rest, medicines, light activity, and use of ice.  Get help if your pain continues or becomes very severe, or if your knee buckles or locks up, or if you have chest pain or trouble breathing. This information is not  intended to replace advice given to you by your health care provider. Make sure you discuss any questions you have with your health care provider. Document Released: 02/18/2007 Document Revised: 04/13/2016 Document Reviewed: 04/13/2016 Elsevier Interactive Patient Education  [employer]2018 [employer]

## 2016-12-10 NOTE — Progress Notes (Signed)
Impression and Recommendations:    1. Acute pain of right knee      No problem-specific Assessment & Plan notes found for this encounter.   The patient was counseled, risk factors were discussed, anticipatory guidance given.   New Prescriptions   No medications on file     Discontinued Medications   No medications on file     Modified Medications   No medications on file      Orders Placed This Encounter  Procedures  . DG Knee 1-2 Views Right     Gross side effects, risk and benefits, and alternatives of medications and treatment plan in general discussed with patient.  Patient is aware that all medications have potential side effects and we are unable to predict every side effect or drug-drug interaction that may occur.   Patient will call with any questions prior to using medication if they have concerns.  Expresses verbal understanding and consents to current therapy and treatment regimen.  No barriers to understanding were identified.  Red flag symptoms and signs discussed in detail.  Patient expressed understanding regarding what to do in case of emergency\urgent symptoms  Please see AVS handed out to patient at the end of our visit for further patient instructions/ counseling done pertaining to today's office visit.   No Follow-up on file.     Note: This document was prepared using Dragon voice recognition software and may include unintentional dictation errors.  Thomasene Lot 1:59 PM --------------------------------------------------------------------------------------------------------------------------------------------------------------------------------------------------------------------------------------------    Subjective:    CC:  Chief Complaint  Patient presents with  . Follow-up    right knee    HPI: Aaron Cooper is a 51 y.o. male who presents to [facility] today for issues as discussed  below.  B/l knee pain for many yrs.  R >er L.   Arthroscopy 10+ tyrs ago. Meniscal tears- by ortho doc in [place].  Some arthritis then as well.   Never feels like going to give way.   Some swelling, pt ion his feet 9 hrs per day or more and on concrete for work.    Up and down stiars and squatting are the W.   - Had 1 injection in the knee prior to sx many yrs ago.  No signs infection-  Redness / f/c / inc warmth etc in knee today or anytime recently.    -  No problems updated.   Wt Readings from Last 3 Encounters:  12/10/16 196 lb 4.8 oz (89 kg)  11/20/16 203 lb (92.1 kg)  03/20/16 206 lb (93.4 kg)   BP Readings from Last 3 Encounters:  12/10/16 126/81  11/20/16 126/77  03/20/16 110/71   Pulse Readings from Last 3 Encounters:  12/10/16 85  11/20/16 77  03/20/16 63   BMI Readings from Last 3 Encounters:  12/10/16 28.78 kg/m  11/20/16 29.76 kg/m  03/20/16 30.20 kg/m     Patient Care Team    Relationship Specialty Notifications Start End  Thomasene Lot, DO PCP - General Family Medicine  11/09/15      Patient Active Problem List   Diagnosis Date Noted  . Tobacco abuse 11/20/2016  . Tobacco abuse counseling 11/20/2016  . Adjustment disorder with mixed anxiety and depressed mood 02/14/2016  . HTN (hypertension) 11/09/2015  . Mood disorder in conditions classified elsewhere 11/09/2015  . HLD (hyperlipidemia) 11/09/2015  . Vitamin D insufficiency 11/09/2015  . Fatigue 11/09/2015  . Generalized osteoarthritis of multiple  sites 11/09/2015  . 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 Obesity (BMI 30-39.9) 11/09/2015    Past Medical history, Surgical history, Family history, Social history, Allergies and Medications have been entered into the medical record, reviewed and changed as needed.    Current Meds  Medication Sig  . atorvastatin (LIPITOR) 80 MG tablet Take 80 mg nightly before bedtime.  . lamoTRIgine (LAMICTAL) 100 MG tablet Take 0.5 tablets (50 mg total) by  mouth 2 (two) times daily.  Marland Kitchen. losartan-hydrochlorothiazide (HYZAAR) 100-12.5 MG tablet Take 1 tablet by mouth daily.  . meloxicam (MOBIC) 15 MG tablet Take 1 tablet (15 mg total) by mouth daily.  . niacin 500 MG CR capsule Take 1 capsule (500 mg total) by mouth 2 (two) times daily with a meal.  . sertraline (ZOLOFT) 50 MG tablet Take 1 tablet (50 mg total) by mouth daily.  . Vitamin D, Ergocalciferol, (DRISDOL) 50000 units CAPS capsule Take one tablet wkly    Allergies:  No Known Allergies   Review of Systems: General:   Denies fever, chills, unexplained weight loss.  Optho/Auditory:   Denies visual changes, blurred vision/LOV Respiratory:   Denies wheeze, DOE more than baseline levels.  Cardiovascular:   Denies chest pain, palpitations, new onset peripheral edema  Gastrointestinal:   Denies nausea, vomiting, diarrhea, abd pain.  Genitourinary: Denies dysuria, freq/ urgency, flank pain or discharge from genitals.  Endocrine:     Denies hot or cold intolerance, polyuria, polydipsia. Musculoskeletal:   Denies unexplained myalgias, joint swelling, unexplained arthralgias, gait problems.  Skin:  Denies new onset rash, suspicious lesions Neurological:     Denies dizziness, unexplained weakness, numbness  Psychiatric/Behavioral:   Denies mood changes, suicidal or homicidal ideations, hallucinations    Objective:   Blood pressure 126/81, pulse 85, height 5' 9.25" (1.759 m), weight 196 lb 4.8 oz (89 kg). Body mass index is 28.78 kg/m. General:  Well Developed, well nourished, appropriate for stated age.  Neuro:  Alert and oriented,  extra-ocular muscles intact  HEENT:  Normocephalic, atraumatic, neck supple, no carotid bruits appreciated  Skin:  no gross rash, warm, pink. Cardiac:  RRR, S1 S2 Respiratory:  ECTA B/L and A/P, Not using accessory muscles, speaking in full sentences- unlabored. Vascular:  Ext warm, no cyanosis apprec.; cap RF less 2 sec. Psych:  No HI/SI, judgement and  insight good, Euthymic mood. Full Affect.

## 2016-12-21 ENCOUNTER — Other Ambulatory Visit: Payer: BLUE CROSS/BLUE SHIELD

## 2016-12-21 DIAGNOSIS — F0630 Mood disorder due to known physiological condition, unspecified: Secondary | ICD-10-CM

## 2016-12-21 DIAGNOSIS — I10 Essential (primary) hypertension: Secondary | ICD-10-CM

## 2016-12-21 DIAGNOSIS — M159 Polyosteoarthritis, unspecified: Secondary | ICD-10-CM

## 2016-12-21 DIAGNOSIS — E559 Vitamin D deficiency, unspecified: Secondary | ICD-10-CM

## 2016-12-21 DIAGNOSIS — R5383 Other fatigue: Secondary | ICD-10-CM

## 2016-12-21 DIAGNOSIS — E782 Mixed hyperlipidemia: Secondary | ICD-10-CM

## 2016-12-21 DIAGNOSIS — Z114 Encounter for screening for human immunodeficiency virus [HIV]: Secondary | ICD-10-CM

## 2016-12-21 DIAGNOSIS — F4323 Adjustment disorder with mixed anxiety and depressed mood: Secondary | ICD-10-CM

## 2016-12-22 LAB — VITAMIN B12: VITAMIN B 12: 484 pg/mL (ref 232–1245)

## 2016-12-22 LAB — COMPREHENSIVE METABOLIC PANEL
ALBUMIN: 4.5 g/dL (ref 3.5–5.5)
ALK PHOS: 101 IU/L (ref 39–117)
ALT: 19 IU/L (ref 0–44)
AST: 15 IU/L (ref 0–40)
Albumin/Globulin Ratio: 1.9 (ref 1.2–2.2)
BILIRUBIN TOTAL: 0.2 mg/dL (ref 0.0–1.2)
BUN / CREAT RATIO: 27 — AB (ref 9–20)
BUN: 24 mg/dL (ref 6–24)
CHLORIDE: 100 mmol/L (ref 96–106)
CO2: 22 mmol/L (ref 20–29)
Calcium: 9.1 mg/dL (ref 8.7–10.2)
Creatinine, Ser: 0.9 mg/dL (ref 0.76–1.27)
GFR calc Af Amer: 113 mL/min/{1.73_m2} (ref >59)
GFR calc non Af Amer: 98 mL/min/{1.73_m2} (ref >59)
GLUCOSE: 100 mg/dL — AB (ref 65–99)
Globulin, Total: 2.4 g/dL (ref 1.5–4.5)
Potassium: 4.5 mmol/L (ref 3.5–5.2)
Sodium: 138 mmol/L (ref 134–144)
Total Protein: 6.9 g/dL (ref 6.0–8.5)

## 2016-12-22 LAB — HEMOGLOBIN A1C
ESTIMATED AVERAGE GLUCOSE: 108 mg/dL
HEMOGLOBIN A1C: 5.4 % (ref 4.8–5.6)

## 2016-12-22 LAB — CBC WITH DIFFERENTIAL/PLATELET
BASOS ABS: 0.1 10*3/uL (ref 0.0–0.2)
Basos: 1 %
EOS (ABSOLUTE): 0.2 10*3/uL (ref 0.0–0.4)
Eos: 2 %
Hematocrit: 45.9 % (ref 37.5–51.0)
Hemoglobin: 15.6 g/dL (ref 13.0–17.7)
Immature Grans (Abs): 0 10*3/uL (ref 0.0–0.1)
Immature Granulocytes: 0 %
LYMPHS ABS: 1.6 10*3/uL (ref 0.7–3.1)
Lymphs: 24 %
MCH: 31.8 pg (ref 26.6–33.0)
MCHC: 34 g/dL (ref 31.5–35.7)
MCV: 94 fL (ref 79–97)
MONOS ABS: 0.5 10*3/uL (ref 0.1–0.9)
Monocytes: 7 %
NEUTROS ABS: 4.5 10*3/uL (ref 1.4–7.0)
Neutrophils: 66 %
PLATELETS: 228 10*3/uL (ref 150–379)
RBC: 4.9 x10E6/uL (ref 4.14–5.80)
RDW: 13.3 % (ref 12.3–15.4)
WBC: 6.9 10*3/uL (ref 3.4–10.8)

## 2016-12-22 LAB — T4, FREE: Free T4: 1.33 ng/dL (ref 0.82–1.77)

## 2016-12-22 LAB — VITAMIN D 25 HYDROXY (VIT D DEFICIENCY, FRACTURES): Vit D, 25-Hydroxy: 28.8 ng/mL — ABNORMAL LOW (ref 30.0–100.0)

## 2016-12-22 LAB — TSH: TSH: 0.581 u[IU]/mL (ref 0.450–4.500)

## 2016-12-22 LAB — LIPID PANEL
CHOLESTEROL TOTAL: 234 mg/dL — AB (ref 100–199)
Chol/HDL Ratio: 6 ratio — ABNORMAL HIGH (ref 0.0–5.0)
HDL: 39 mg/dL — ABNORMAL LOW (ref >39)
LDL CALC: 165 mg/dL — AB (ref 0–99)
TRIGLYCERIDES: 150 mg/dL — AB (ref 0–149)
VLDL CHOLESTEROL CAL: 30 mg/dL (ref 5–40)

## 2016-12-22 LAB — HIV ANTIBODY (ROUTINE TESTING W REFLEX): HIV Screen 4th Generation wRfx: NONREACTIVE

## 2017-01-09 ENCOUNTER — Other Ambulatory Visit: Payer: Self-pay

## 2017-01-09 DIAGNOSIS — E559 Vitamin D deficiency, unspecified: Secondary | ICD-10-CM

## 2017-01-09 MED ORDER — VITAMIN D (ERGOCALCIFEROL) 1.25 MG (50000 UNIT) PO CAPS: ORAL_CAPSULE | ORAL | 10 refills | Status: DC

## 2017-01-09 NOTE — Telephone Encounter (Signed)
Pharmacy sent of refill request for Vitamin D.

## 2017-01-14 ENCOUNTER — Telehealth: Payer: Self-pay

## 2017-01-14 NOTE — Telephone Encounter (Signed)
Received Epic message alerting that pt has not read MyChart message regarding lab test results.  LVM for pt to call to discuss results.  Aaron Cooper. Islam Villescas, CMA

## 2017-01-24 NOTE — Telephone Encounter (Signed)
LVM for pt to call to discuss results.  T. Brinlyn Cena, CMA  

## 2017-04-18 ENCOUNTER — Other Ambulatory Visit: Payer: Self-pay

## 2017-04-18 DIAGNOSIS — F4323 Adjustment disorder with mixed anxiety and depressed mood: Secondary | ICD-10-CM

## 2017-04-18 DIAGNOSIS — F0630 Mood disorder due to known physiological condition, unspecified: Secondary | ICD-10-CM

## 2017-04-18 MED ORDER — LAMOTRIGINE 100 MG PO TABS: 50.0000 mg | ORAL_TABLET | Freq: Two times a day (BID) | ORAL | 0 refills | Status: DC

## 2017-04-18 NOTE — Telephone Encounter (Signed)
Pharmacy sent over refill request for lamotrigine. Sent in 30 day supply of medication to the pharmacy after review of chart.Patient needs follow up for further refills  MPulliam, CMA/RT(R)

## 2017-04-22 ENCOUNTER — Telehealth: Payer: Self-pay | Admitting: Family Medicine

## 2017-04-22 NOTE — Telephone Encounter (Signed)
Called patient to set up a Provider required OV for medication refill--glh

## 2017-06-03 ENCOUNTER — Encounter: Payer: Self-pay | Admitting: Family Medicine

## 2017-06-03 ENCOUNTER — Ambulatory Visit (INDEPENDENT_AMBULATORY_CARE_PROVIDER_SITE_OTHER): Payer: BLUE CROSS/BLUE SHIELD | Admitting: Family Medicine

## 2017-06-03 VITALS — BP 121/80 | HR 77 | Ht 69.25 in | Wt 206.6 lb

## 2017-06-03 DIAGNOSIS — Z716 Tobacco abuse counseling: Secondary | ICD-10-CM | POA: Diagnosis not present

## 2017-06-03 DIAGNOSIS — E782 Mixed hyperlipidemia: Secondary | ICD-10-CM

## 2017-06-03 DIAGNOSIS — E669 Obesity, unspecified: Secondary | ICD-10-CM | POA: Diagnosis not present

## 2017-06-03 DIAGNOSIS — Z77098 Contact with and (suspected) exposure to other hazardous, chiefly nonmedicinal, chemicals: Secondary | ICD-10-CM

## 2017-06-03 DIAGNOSIS — I10 Essential (primary) hypertension: Secondary | ICD-10-CM | POA: Diagnosis not present

## 2017-06-03 DIAGNOSIS — Z720 Tobacco use: Secondary | ICD-10-CM

## 2017-06-03 DIAGNOSIS — E781 Pure hyperglyceridemia: Secondary | ICD-10-CM | POA: Diagnosis not present

## 2017-06-03 DIAGNOSIS — E559 Vitamin D deficiency, unspecified: Secondary | ICD-10-CM | POA: Diagnosis not present

## 2017-06-03 DIAGNOSIS — F4323 Adjustment disorder with mixed anxiety and depressed mood: Secondary | ICD-10-CM | POA: Diagnosis not present

## 2017-06-03 DIAGNOSIS — M159 Polyosteoarthritis, unspecified: Secondary | ICD-10-CM

## 2017-06-03 DIAGNOSIS — F39 Unspecified mood [affective] disorder: Secondary | ICD-10-CM | POA: Diagnosis not present

## 2017-06-03 LAB — POCT URINALYSIS DIPSTICK
Bilirubin, UA: NEGATIVE
Blood, UA: NEGATIVE
Glucose, UA: NEGATIVE
Ketones, UA: NEGATIVE
Leukocytes, UA: NEGATIVE
Nitrite, UA: NEGATIVE
Protein, UA: NEGATIVE
Spec Grav, UA: 1.02 (ref 1.010–1.025)
Urobilinogen, UA: 0.2 E.U./dL
pH, UA: 5.5 (ref 5.0–8.0)

## 2017-06-03 MED ORDER — MELOXICAM 15 MG PO TABS: 15.0000 mg | ORAL_TABLET | Freq: Every day | ORAL | 1 refills | Status: DC

## 2017-06-03 MED ORDER — SERTRALINE HCL 50 MG PO TABS: 50.0000 mg | ORAL_TABLET | Freq: Every day | ORAL | 1 refills | Status: DC

## 2017-06-03 MED ORDER — VITAMIN D (ERGOCALCIFEROL) 1.25 MG (50000 UNIT) PO CAPS: ORAL_CAPSULE | ORAL | 10 refills | Status: DC

## 2017-06-03 MED ORDER — ATORVASTATIN CALCIUM 80 MG PO TABS: ORAL_TABLET | ORAL | 1 refills | Status: DC

## 2017-06-03 MED ORDER — LAMOTRIGINE 100 MG PO TABS: 50.0000 mg | ORAL_TABLET | Freq: Two times a day (BID) | ORAL | 1 refills | Status: DC

## 2017-06-03 MED ORDER — LOSARTAN POTASSIUM-HCTZ 100-12.5 MG PO TABS: 1.0000 | ORAL_TABLET | Freq: Every day | ORAL | 1 refills | Status: DC

## 2017-06-03 MED ORDER — NIACIN ER 500 MG PO CPCR: 500.0000 mg | ORAL_CAPSULE | Freq: Two times a day (BID) | ORAL | 1 refills | Status: DC

## 2017-06-03 NOTE — Progress Notes (Signed)
Impression and Recommendations:    1. Occupational exposure to chemicals-Aaron Cooper served at [employer] from 1984 through 86   2. Mixed hyperlipidemia   3. Mood disorder ([place])   4. Essential hypertension   5. Hypertriglyceridemia   6. Vitamin D insufficiency   7. Generalized osteoarthritis of multiple sites   8. Adjustment disorder with mixed anxiety and depressed mood   9. Obesity (BMI 30-39.9)   10. Contact with and (suspected) exposure to other hazardous, chiefly nonmedicinal, chemicals   11. Tobacco abuse   12. Tobacco abuse counseling     1. Occupational exposure to chemicals- Will order UA   2. Vitamin D insufficiency- Pt is compliant and tolerating his supplements well. Continue supplements as listed below.  3. Adjustment disorder with mixed anxiety- continue meds as prescribed below.   4. Hypertriglyceridemia- Dietary and exercise guidelines discussed with Aaron Cooper. Recommended pt to reduce intake of saturated, trans fats and fatty carbohydrates. Handouts provided if desired. -Pt is compliant and tolerating meds well. Continue meds as listed below. -Refill meds today.  5. Generalized osteoarthritis- continue meds as listed below. Pt is stable and tolerating his medications well.  6. Essential hypertension-  - Bp good control -eat low sodium foods. Exercise and dietary guidelines described. Continue meds as listed below.   7. Mood disorder in conditions classified elsewhere- - Pt is compliant and tolerating his medications well. Continue as prescribed. - Refill zoloft today. Mood stable, declines need for change of dose etc  8. Mixed hyperlipidemia -Dietary and exercise guidelines discussed with Aaron Cooper. Recommended pt to reduce intake of saturated, trans fats and fatty carbohydrates. Handouts provided if desired.   9. Obesity - Encouraged pt to lose weight. Encouraged pt to exercise daily.  10. Contact with chemical exposure-Aaron Cooper made me aware today that while  working at [employer] in the 80s he was exposed to dangerous hazardous chemicals to his health.  Explained to Aaron Cooper is important he go for all of his regular screening tests including colonoscopy, come in for rectal exams per prostate checks etc.  We will add UA in addition to other blood work today.    11. Tobacco abuse- Encouraged pt to stop smoking. Pt is still in the pre contemplative stage of smoking cessation.  -Recheck labs today.   -Follow up in 4 months for complete physical exam. Pt discussed the need for colonoscopy and he will do this at next OV.   --->   We will consider ordering CBC next office visit with differential as additional screening due to chemical exposures.   Orders Placed This Encounter  Procedures  . Lipid panel  . VITAMIN D 25 Hydroxy (Vit-D Deficiency, Fractures)  . Comprehensive metabolic panel  . CBC with Differential/Platelet  . POCT urinalysis dipstick    Meds ordered this encounter  Medications  . Vitamin D, Ergocalciferol, (DRISDOL) 50000 units CAPS capsule    Sig: Take one tablet wkly    Dispense:  12 capsule    Refill:  10  . sertraline (ZOLOFT) 50 MG tablet    Sig: Take 1 tablet (50 mg total) by mouth daily.    Dispense:  90 tablet    Refill:  1  . niacin 500 MG CR capsule    Sig: Take 1 capsule (500 mg total) by mouth 2 (two) times daily with a meal.    Dispense:  180 capsule    Refill:  1  . meloxicam (MOBIC) 15 MG tablet    Sig:  Take 1 tablet (15 mg total) by mouth daily.    Dispense:  90 tablet    Refill:  1    Needs appointment  . losartan-hydrochlorothiazide (HYZAAR) 100-12.5 MG tablet    Sig: Take 1 tablet by mouth daily.    Dispense:  90 tablet    Refill:  1  . lamoTRIgine (LAMICTAL) 100 MG tablet    Sig: Take 0.5 tablets (50 mg total) by mouth 2 (two) times daily.    Dispense:  90 tablet    Refill:  1    Needs appointment  . atorvastatin (LIPITOR) 80 MG tablet    Sig: Take 80 mg nightly before bedtime.    Dispense:  90  tablet    Refill:  1   Pt was in the office today for 40+ minutes, with over 50% time spent in face to face counseling of patients various medical conditions, treatment plans of those medical conditions including medicine management and lifestyle modification, strategies to improve health and well being; and in coordination of care. SEE ABOVE FOR DETAILS  Gross side effects, risk and benefits, and alternatives of medications and treatment plan in general discussed with Aaron Cooper.  Aaron Cooper is aware that all medications have potential side effects and we are unable to predict every side effect or drug-drug interaction that may occur.   Aaron Cooper will call with any questions prior to using medication if they have concerns.  Expresses verbal understanding and consents to current therapy and treatment regimen.  No barriers to understanding were identified.  Red flag symptoms and signs discussed in detail.  Aaron Cooper expressed understanding regarding what to do in case of emergency\urgent symptoms  Please see AVS handed out to Aaron Cooper at the end of our visit for further Aaron Cooper instructions/ counseling done pertaining to today's office visit.   Return for In 4- 5 months please come in for a complete yearly physical.    Note: This note was prepared with assistance of Dragon voice recognition software. Occasional wrong-word or sound-a-like substitutions may have occurred due to the inherent limitations of voice recognition software.  This document serves as a record of services personally performed by Mellody Dance, DO. It was created on her behalf by Mayer Masker, a trained medical scribe. The creation of this record is based on the scribe's personal observations and the provider's statements to them.   I have reviewed the above medical documentation for accuracy and completeness and I concur.  Mellody Dance 06/17/17 6:26  PM  --------------------------------------------------------------------------------------------------------------------------------------------------------------------------------------------------------------------------------------------    Subjective:     HPI: Aaron Cooper is a 52 y.o. male who presents to [facility] today for issues as discussed below.  HTN: Pt is not checking at home. He is compliant with his medications at home. He denies HA, dizziness, CP or exertional new symptoms.  14 point review of systems is grossly negative. He denies any new symptoms as related to his medications.  Mood: pt ran out of zoloft 2 weeks ago.  He states his mood has "evened out" with current medications we have him on.   Aaron Cooper's wife also states his mood is fine.  Hyperlipidemia: Aaron Cooper is tolerating his medications well.  Takes them nightly.  No new complaints of myalgias or new arthralgias.  Arthralgias: People Aaron Cooper takes Mobic daily.  Tolerating well.  He is able to complete his very physical job activities at work with the medicine.  Without it he is not able to do his ADLs.  Pt is concerned about drinking contaminated water from [facility] during the affected years of [phone]. There are reports of exposure to solvents including benzene, gasoline, various detergents etc. in drinking water.  This was related to multiple\various cancers, multiple myeloma, Parkinson's disease, leukemias lymphomas etc.  Pt is smoking 1 pack/day.  Has not thought about quitting.  Wife still smokes as well which makes it difficult for him.  The  Pt has not had a colonoscopy yet.  We referred him back in 2017 and Aaron Cooper failed to go.  I again reminded him the importance of this.  Says he will consider again in the future when he comes in for physical.    Wt Readings from Last 3 Encounters:  06/03/17 206 lb 9.6 oz (93.7 kg)  12/10/16 196 lb 4.8 oz (89 kg)  11/20/16 203  lb (92.1 kg)   BP Readings from Last 3 Encounters:  06/03/17 121/80  12/10/16 126/81  11/20/16 126/77   Pulse Readings from Last 3 Encounters:  06/03/17 77  12/10/16 85  11/20/16 77   BMI Readings from Last 3 Encounters:  06/03/17 30.29 kg/m  12/10/16 28.78 kg/m  11/20/16 29.76 kg/m     Aaron Cooper Care Team    Relationship Specialty Notifications Start End  Mellody Dance, DO PCP - General Family Medicine  11/09/15      Aaron Cooper Active Problem List   Diagnosis Date Noted  . HTN (hypertension) 11/09/2015    Priority: High  . HLD (hyperlipidemia) 11/09/2015    Priority: High  . Mood disorder ([place]) 11/09/2015    Priority: Medium  . Vitamin D insufficiency 11/09/2015    Priority: Medium  . Tobacco abuse 11/20/2016    Priority: Low  . Tobacco abuse counseling 11/20/2016    Priority: Low  . Occupational exposure to chemicals-Aaron Cooper served at [employer] from 1984 through 86 06/03/2017  . Contact with and (suspected) exposure to other hazardous, chiefly nonmedicinal, chemicals 06/03/2017  . Adjustment disorder with mixed anxiety and depressed mood 02/14/2016  . Fatigue 11/09/2015  . Generalized osteoarthritis of multiple sites 11/09/2015  . 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 Obesity (BMI 30-39.9) 11/09/2015    Past Medical history, Surgical history, Family history, Social history, Allergies and Medications have been entered into the medical record, reviewed and changed as needed.    Current Meds  Medication Sig  . atorvastatin (LIPITOR) 80 MG tablet Take 80 mg nightly before bedtime.  . lamoTRIgine (LAMICTAL) 100 MG tablet Take 0.5 tablets (50 mg total) by mouth 2 (two) times daily.  Marland Kitchen losartan-hydrochlorothiazide (HYZAAR) 100-12.5 MG tablet Take 1 tablet by mouth daily.  . meloxicam (MOBIC) 15 MG tablet Take 1 tablet (15 mg total) by mouth daily.  . niacin 500 MG CR capsule Take 1 capsule (500 mg total) by mouth 2 (two) times daily with a meal.  . sertraline (ZOLOFT)  50 MG tablet Take 1 tablet (50 mg total) by mouth daily.  . Vitamin D, Ergocalciferol, (DRISDOL) 50000 units CAPS capsule Take one tablet wkly  . [DISCONTINUED] atorvastatin (LIPITOR) 80 MG tablet Take 80 mg nightly before bedtime.  . [DISCONTINUED] lamoTRIgine (LAMICTAL) 100 MG tablet Take 0.5 tablets (50 mg total) by mouth 2 (two) times daily.  . [DISCONTINUED] losartan-hydrochlorothiazide (HYZAAR) 100-12.5 MG tablet Take 1 tablet by mouth daily.  . [DISCONTINUED] meloxicam (MOBIC) 15 MG tablet Take 1 tablet (15 mg total) by mouth daily.  . [DISCONTINUED] niacin 500 MG CR capsule Take 1 capsule (500 mg total) by mouth 2 (two)  times daily with a meal.  . [DISCONTINUED] sertraline (ZOLOFT) 50 MG tablet Take 1 tablet (50 mg total) by mouth daily.  . [DISCONTINUED] Vitamin D, Ergocalciferol, (DRISDOL) 50000 units CAPS capsule Take one tablet wkly    Allergies:  No Known Allergies   Review of Systems:  A fourteen system review of systems was performed and found to be positive as per HPI.   Objective:   Blood pressure 121/80, pulse 77, height 5' 9.25" (1.759 m), weight 206 lb 9.6 oz (93.7 kg), SpO2 98 %. Body mass index is 30.29 kg/m. General:  Well Developed, well nourished, appropriate for stated age.  Neuro:  Alert and oriented,  extra-ocular muscles intact  HEENT:  Normocephalic, atraumatic, neck supple, no carotid bruits appreciated  Skin:  no gross rash, warm, pink. Cardiac:  RRR, S1 S2 Respiratory:  ECTA B/L and A/P, Not using accessory muscles, speaking in full sentences- unlabored. Vascular:  Ext warm, no cyanosis apprec.; cap RF less 2 sec. Psych:  No HI/SI, judgement and insight good, Euthymic mood. Full Affect.

## 2017-06-04 LAB — COMPREHENSIVE METABOLIC PANEL
ALBUMIN: 4.9 g/dL (ref 3.5–5.5)
ALK PHOS: 118 IU/L — AB (ref 39–117)
ALT: 22 IU/L (ref 0–44)
AST: 18 IU/L (ref 0–40)
Albumin/Globulin Ratio: 1.6 (ref 1.2–2.2)
BILIRUBIN TOTAL: 0.3 mg/dL (ref 0.0–1.2)
BUN / CREAT RATIO: 15 (ref 9–20)
BUN: 15 mg/dL (ref 6–24)
CHLORIDE: 96 mmol/L (ref 96–106)
CO2: 22 mmol/L (ref 20–29)
Calcium: 10 mg/dL (ref 8.7–10.2)
Creatinine, Ser: 1.01 mg/dL (ref 0.76–1.27)
GFR calc Af Amer: 98 mL/min/{1.73_m2} (ref >59)
GFR calc non Af Amer: 85 mL/min/{1.73_m2} (ref >59)
GLOBULIN, TOTAL: 3 g/dL (ref 1.5–4.5)
Glucose: 88 mg/dL (ref 65–99)
Potassium: 4.5 mmol/L (ref 3.5–5.2)
SODIUM: 139 mmol/L (ref 134–144)
Total Protein: 7.9 g/dL (ref 6.0–8.5)

## 2017-06-04 LAB — LIPID PANEL
Chol/HDL Ratio: 5.7 ratio — ABNORMAL HIGH (ref 0.0–5.0)
Cholesterol, Total: 243 mg/dL — ABNORMAL HIGH (ref 100–199)
HDL: 43 mg/dL (ref >39)
LDL CALC: 169 mg/dL — AB (ref 0–99)
TRIGLYCERIDES: 156 mg/dL — AB (ref 0–149)
VLDL CHOLESTEROL CAL: 31 mg/dL (ref 5–40)

## 2017-06-04 LAB — VITAMIN D 25 HYDROXY (VIT D DEFICIENCY, FRACTURES): Vit D, 25-Hydroxy: 21.3 ng/mL — ABNORMAL LOW (ref 30.0–100.0)

## 2017-07-22 ENCOUNTER — Encounter: Payer: Self-pay | Admitting: Family Medicine

## 2017-10-02 ENCOUNTER — Encounter: Payer: Self-pay | Admitting: Family Medicine

## 2017-10-02 ENCOUNTER — Ambulatory Visit (INDEPENDENT_AMBULATORY_CARE_PROVIDER_SITE_OTHER): Payer: BLUE CROSS/BLUE SHIELD | Admitting: Family Medicine

## 2017-10-02 VITALS — BP 126/82 | HR 74 | Ht 69.0 in | Wt 202.9 lb

## 2017-10-02 DIAGNOSIS — R6882 Decreased libido: Secondary | ICD-10-CM

## 2017-10-02 DIAGNOSIS — E782 Mixed hyperlipidemia: Secondary | ICD-10-CM | POA: Diagnosis not present

## 2017-10-02 DIAGNOSIS — E559 Vitamin D deficiency, unspecified: Secondary | ICD-10-CM | POA: Diagnosis not present

## 2017-10-02 DIAGNOSIS — E781 Pure hyperglyceridemia: Secondary | ICD-10-CM

## 2017-10-02 DIAGNOSIS — R5383 Other fatigue: Secondary | ICD-10-CM

## 2017-10-02 DIAGNOSIS — I10 Essential (primary) hypertension: Secondary | ICD-10-CM

## 2017-10-02 DIAGNOSIS — E669 Obesity, unspecified: Secondary | ICD-10-CM

## 2017-10-02 NOTE — Progress Notes (Signed)
Male physical  Impression and Recommendations:    1. Essential hypertension   2. Mixed hyperlipidemia   3. Vitamin D insufficiency   4. Fatigue, unspecified type   5. Obesity (BMI 30-39.9)   6. Hypertriglyceridemia   7. Decreased libido      1) Anticipatory Guidance: Discussed importance of wearing a seatbelt while driving, not texting while driving;   sunscreen when outside along with skin surveillance; eating a balanced and modest diet; physical activity at least 25 minutes per day or 150 min/ week moderate to intense activity.  2) Immunizations / Screenings / Labs:  All immunizations are up-to-date per recommendations or will be updated today. Patient is due for dental and vision screens which pt will schedule independently.   -pt is fasting today.  -Will obtain CBC, CMP, HgA1c, Lipid panel, TSH and vit D when fasting, if not already done recently.  -Add b12, Hep C, and testosterone.   3) Weight:  BMI meaning discussed with patient.  Discussed goal of losing 5-10% of current body weight which would improve overall feelings of well being and improve objective health data. Improve nutrient density of diet through increasing intake of fruits and vegetables and decreasing saturated fats, white flour products and refined sugars.   -Counseled pt on the importance of getting routine health screenings.  -pt declines referral to GI for colonoscopy at this time. Sent home with cologuard in the mean time.   -shingrix vaccination information given today.  -counseled pt about sexual health and ways to improve libido.  -recommend starting 81 mg aspirin qd.   Orders Placed This Encounter  Procedures  . CBC with Differential/Platelet  . Comprehensive metabolic panel  . Hemoglobin A1c  . Lipid panel  . TSH  . VITAMIN D 25 Hydroxy (Vit-D Deficiency, Fractures)  . T4, free  . Testosterone,Free and Total  . Vitamin B12    Gross side effects, risk and benefits, and  alternatives of medications discussed with patient.  Patient is aware that all medications have potential side effects and we are unable to predict every side effect or drug-drug interaction that may occur.  Expresses verbal understanding and consents to current therapy plan and treatment regimen.  Please see AVS handed out to patient at the end of our visit for further patient instructions/ counseling done pertaining to today's office visit.  Follow-up preventative CPE in 1 year. Follow-up office visit pending lab work.   - F/up sooner for chronic care management ( q 78mo) and/or prn  This document serves as a record of services personally performed by Thomasene Lot, DO. It was created on her behalf by Thelma Barge, a trained medical scribe. The creation of this record is based on the scribe's personal observations and the provider's statements to them.   I have reviewed the above medical documentation for accuracy and completeness and I concur.  Thomasene Lot 10/02/17 12:25 PM   Subjective:    CC: CPE  HPI: Aaron Cooper is a 52 y.o. male who presents to [facility] today for a yearly health maintenance exam.    Health Maintenance Summary Reviewed and updated, unless pt declines services.  Colonoscopy:    Not UTD- has not been yet. Denies any symptoms related to GI. Tobacco History Reviewed:   Current smoker.  CT scan for screening lung CA:   NA. Abdominal Ultrasound:  NA- never had AAA screening before. Per pt's wife, his father has a FMHx  of AAA that killed him age 72s. Pt also has FMHx of MI in his brother.  Alcohol:    No concerns, no excessive use Exercise Habits:   Not meeting AHA guidelines.  STD concerns:   none Drug Use:   None Birth control method:   n/a Testicular/penile concerns:     No difficulty obtaining an erection, but has had trouble maintaining an erection. No other screens. Denies bumps, rashes.  Vision Last eye exam was "a  few years ago". Denies any symptoms.   Dental Has not seen a dentist in years- has dentures/implants.   Skin Does not wear sunscreen.   Colonoscopy- not UTD.  Pt has no FMHx of colon cancer.  Sexual health He has difficulty maintaining his erection now. He has sex twice a month with his wife.   Immunization History  Administered Date(s) Administered  . Influenza,inj,Quad PF,6+ Mos 02/14/2016  . Tdap 12/10/2016    Health Maintenance  Topic Date Due  . COLONOSCOPY  11/20/2017 (Originally 05/17/2014)  . INFLUENZA VACCINE  12/05/2017  . TETANUS/TDAP  12/11/2026  . HIV Screening  Completed      Wt Readings from Last 3 Encounters:  10/02/17 202 lb 14.4 oz (92 kg)  06/03/17 206 lb 9.6 oz (93.7 kg)  12/10/16 196 lb 4.8 oz (89 kg)   BP Readings from Last 3 Encounters:  10/02/17 126/82  06/03/17 121/80  12/10/16 126/81   Pulse Readings from Last 3 Encounters:  10/02/17 74  06/03/17 77  12/10/16 85    Patient Active Problem List   Diagnosis Date Noted  . HTN (hypertension) 11/09/2015    Priority: High  . HLD (hyperlipidemia) 11/09/2015    Priority: High  . Mood disorder ([facility]) 11/09/2015    Priority: Medium  . Vitamin D insufficiency 11/09/2015    Priority: Medium  . Tobacco abuse 11/20/2016    Priority: Low  . Tobacco abuse counseling 11/20/2016    Priority: Low  . Occupational exposure to chemicals-patient served at [employer] from 1984 through 87 06/03/2017  . Contact with and (suspected) exposure to other hazardous, chiefly nonmedicinal, chemicals 06/03/2017  . Adjustment disorder with mixed anxiety and depressed mood 02/14/2016  . Fatigue 11/09/2015  . Generalized osteoarthritis of multiple sites 11/09/2015  . 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 Obesity (BMI 30-39.9) 11/09/2015    Past Medical History:  Diagnosis Date  . Arthritis   . Depression   . Fatigue 11/09/2015  . HLD (hyperlipidemia) 11/09/2015  . HTN (hypertension) 11/09/2015  . Mood disorder in  conditions classified elsewhere 11/09/2015  . Obesity (BMI 30-39.9) 11/09/2015  . Overweight (BMI 25.0-29.9) 11/09/2015  . Vitamin D insufficiency 11/09/2015    Past Surgical History:  Procedure Laterality Date  . BACK SURGERY  2013  . HAND SURGERY Right   . KNEE SURGERY Right 1995    Family History  Problem Relation Age of Onset  . Diabetes Mother   . Hyperlipidemia Mother   . Heart attack Father   . Hyperlipidemia Father   . Hypertension Father   . Cancer Sister        breast  . Stroke Maternal Aunt   . Alcohol abuse Maternal Grandmother   . Heart attack Brother   . Alcohol abuse Brother     Social History   Substance and Sexual Activity  Drug Use No  ,  Social History   Substance and Sexual Activity  Alcohol Use No  ,  Social History   Tobacco Use  Smoking  Status Current Every Day Smoker  . Packs/day: 1.50  . Years: 25.00  . Pack years: 37.50  Smokeless Tobacco Never Used  Tobacco Comment   Pt refused cessation material  ,  Social History   Substance and Sexual Activity  Sexual Activity Yes    Patient's Medications  New Prescriptions   No medications on file  Previous Medications   ATORVASTATIN (LIPITOR) 80 MG TABLET    Take 80 mg nightly before bedtime.   LAMOTRIGINE (LAMICTAL) 100 MG TABLET    Take 0.5 tablets (50 mg total) by mouth 2 (two) times daily.   LOSARTAN-HYDROCHLOROTHIAZIDE (HYZAAR) 100-12.5 MG TABLET    Take 1 tablet by mouth daily.   MELOXICAM (MOBIC) 15 MG TABLET    Take 1 tablet (15 mg total) by mouth daily.   NIACIN 500 MG CR CAPSULE    Take 1 capsule (500 mg total) by mouth 2 (two) times daily with a meal.   SERTRALINE (ZOLOFT) 50 MG TABLET    Take 1 tablet (50 mg total) by mouth daily.   VITAMIN D, ERGOCALCIFEROL, (DRISDOL) 50000 UNITS CAPS CAPSULE    Take one tablet wkly  Modified Medications   No medications on file  Discontinued Medications   No medications on file    Patient has no known allergies.  Review of  Systems: General:   Denies fever, chills, unexplained weight loss.  Optho/Auditory:   Denies visual changes, blurred vision/LOV Respiratory:   Denies SOB, DOE more than baseline levels.  Cardiovascular:   Denies chest pain, palpitations, new onset peripheral edema  Gastrointestinal:   Denies nausea, vomiting, diarrhea.  Genitourinary: Denies dysuria, freq/ urgency, flank pain or discharge from genitals.  Endocrine:     Denies hot or cold intolerance, polyuria, polydipsia. Musculoskeletal:   Denies unexplained myalgias, joint swelling, unexplained arthralgias, gait problems.  Skin:  Denies rash, suspicious lesions Neurological:     Denies dizziness, unexplained weakness, numbness  Psychiatric/Behavioral:   Denies mood changes, suicidal or homicidal ideations, hallucinations    Objective:     Blood pressure 126/82, pulse 74, height  (1.753 m), weight 202 lb 14.4 oz (92 kg), SpO2 98 %. Body mass index is 29.96 kg/m. General Appearance:    Alert, cooperative, no distress, appears stated age  Head:    Normocephalic, without obvious abnormality, atraumatic  Eyes:    PERRL, conjunctiva/corneas clear, EOM's intact, fundi    benign, both eyes  Ears:    Normal TM's and external ear canals, both ears  Nose:   Nares normal, septum midline, mucosa normal, no drainage    or sinus tenderness  Throat:   Lips w/o lesion, mucosa moist, and tongue normal; teeth and   gums normal  Neck:   Supple, symmetrical, trachea midline, no adenopathy;    thyroid:  no enlargement/tenderness/nodules; no carotid   bruit or JVD  Back:     Symmetric, no curvature, ROM normal, no CVA tenderness  Lungs:     Clear to auscultation bilaterally, respirations unlabored, no       Wh/ R/ R  Chest Wall:    No tenderness or gross deformity; normal excursion   Heart:    Regular rate and rhythm, S1 and S2 normal, no murmur, rub   or gallop  Abdomen:     Soft, non-tender, bowel sounds active all four quadrants, NO   G/R/R,  no masses, no organomegaly  Genitalia:    Ext genitalia: without lesion, no penile rash or discharge, no hernias appreciated  Rectal:    Normal tone, prostate WNL's and equal b/l, no tenderness; guaiac negative stool.   Extremities:   Extremities normal, atraumatic, no cyanosis or gross edema  Pulses:   2+ and symmetric all extremities  Skin:   Warm, dry, Skin color, texture, turgor normal, no obvious rashes or lesions  M-Sk:   Ambulates * 4 w/o difficulty, no gross deformities, tone WNL  Neurologic:   CNII-XII intact, normal strength, sensation and reflexes    Throughout Psych:  No HI/SI, judgement and insight good, Euthymic mood. Full Affect.

## 2017-10-02 NOTE — Patient Instructions (Addendum)
Please call and speak with your insurance about the Shingrix shingles vaccine and ask if you need to have it at a specific pharmacy or if you can get it at your doctor's office. Ask them what the cost would be. Also please call and ask about the cologuard and setting this test up. If you call the number on the pamphlet we gave you, they will check about your insurance and copay required, etc.  -Recommend you start 81 mg aspirin every day to prevent cardiovascular disease events and colorectal cancer.   Preventive Care for Adults, Male A healthy lifestyle and preventive care can promote health and wellness. Preventive health guidelines for men include the following key practices:  A routine yearly physical is a good way to check with your health care provider about your health and preventative screening. It is a chance to share any concerns and updates on your health and to receive a thorough exam.  Visit your dentist for a routine exam and preventative care every 6 months. Brush your teeth twice a day and floss once a day. Good oral hygiene prevents tooth decay and gum disease.  The frequency of eye exams is based on your age, health, family medical history, use of contact lenses, and other factors. Follow your health care provider's recommendations for frequency of eye exams.  Eat a healthy diet. Foods such as vegetables, fruits, whole grains, low-fat dairy products, and lean protein foods contain the nutrients you need without too many calories. Decrease your intake of foods high in solid fats, added sugars, and salt. Eat the right amount of calories for you.Get information about a proper diet from your health care provider, if necessary.  Regular physical exercise is one of the most important things you can do for your health. Most adults should get at least 150 minutes of moderate-intensity exercise (any activity that increases your heart rate and causes you to sweat) each week. In addition,  most adults need muscle-strengthening exercises on 2 or more days a week.  Maintain a healthy weight. The body mass index (BMI) is a screening tool to identify possible weight problems. It provides an estimate of body fat based on height and weight. Your health care provider can find your BMI and can help you achieve or maintain a healthy weight.For adults 20 years and older:  A BMI below 18.5 is considered underweight.  A BMI of 18.5 to 24.9 is normal.  A BMI of 25 to 29.9 is considered overweight.  A BMI of 30 and above is considered obese.  Maintain normal blood lipids and cholesterol levels by exercising and minimizing your intake of saturated fat. Eat a balanced diet with plenty of fruit and vegetables. Blood tests for lipids and cholesterol should begin at age 78 and be repeated every 5 years. If your lipid or cholesterol levels are high, you are over 50, or you are at high risk for heart disease, you may need your cholesterol levels checked more frequently.Ongoing high lipid and cholesterol levels should be treated with medicines if diet and exercise are not working.  If you smoke, find out from your health care provider how to quit. If you do not use tobacco, do not start.  Lung cancer screening is recommended for adults aged 77-80 years who are at high risk for developing lung cancer because of a history of smoking. A yearly low-dose CT scan of the lungs is recommended for people who have at least a 30-pack-year history of smoking and are  a current smoker or have quit within the past 15 years. A pack year of smoking is smoking an average of 1 pack of cigarettes a day for 1 year (for example: 1 pack a day for 30 years or 2 packs a day for 15 years). Yearly screening should continue until the smoker has stopped smoking for at least 15 years. Yearly screening should be stopped for people who develop a health problem that would prevent them from having lung cancer treatment.  If you choose  to drink alcohol, do not have more than 2 drinks per day. One drink is considered to be 12 ounces (355 mL) of beer, 5 ounces (148 mL) of wine, or 1.5 ounces (44 mL) of liquor.  Avoid use of street drugs. Do not share needles with anyone. Ask for help if you need support or instructions about stopping the use of drugs.  High blood pressure causes heart disease and increases the risk of stroke. Your blood pressure should be checked at least every 1-2 years. Ongoing high blood pressure should be treated with medicines, if weight loss and exercise are not effective.  If you are 58-53 years old, ask your health care provider if you should take aspirin to prevent heart disease.  Diabetes screening is done by taking a blood sample to check your blood glucose level after you have not eaten for a certain period of time (fasting). If you are not overweight and you do not have risk factors for diabetes, you should be screened once every 3 years starting at age 18. If you are overweight or obese and you are 19-86 years of age, you should be screened for diabetes every year as part of your cardiovascular risk assessment.  Colorectal cancer can be detected and often prevented. Most routine colorectal cancer screening begins at the age of 3 and continues through age 75. However, your health care provider may recommend screening at an earlier age if you have risk factors for colon cancer. On a yearly basis, your health care provider may provide home test kits to check for hidden blood in the stool. Use of a small camera at the end of a tube to directly examine the colon (sigmoidoscopy or colonoscopy) can detect the earliest forms of colorectal cancer. Talk to your health care provider about this at age 76, when routine screening begins. Direct exam of the colon should be repeated every 5-10 years through age 44, unless early forms of precancerous polyps or small growths are found.  People who are at an increased risk  for hepatitis B should be screened for this virus. You are considered at high risk for hepatitis B if:  You were born in a country where hepatitis B occurs often. Talk with your health care provider about which countries are considered high risk.  Your parents were born in a high-risk country and you have not received a shot to protect against hepatitis B (hepatitis B vaccine).  You have HIV or AIDS.  You use needles to inject street drugs.  You live with, or have sex with, someone who has hepatitis B.  You are a man who has sex with other men (MSM).  You get hemodialysis treatment.  You take certain medicines for conditions such as cancer, organ transplantation, and autoimmune conditions.  Hepatitis C blood testing is recommended for all people born from [birth year] through 1965 and any individual with known risks for hepatitis C.  Practice safe sex. Use condoms and avoid high-risk sexual practices  to reduce the spread of sexually transmitted infections (STIs). STIs include gonorrhea, chlamydia, syphilis, trichomonas, herpes, HPV, and human immunodeficiency virus (HIV). Herpes, HIV, and HPV are viral illnesses that have no cure. They can result in disability, cancer, and death.  If you are a man who has sex with other men, you should be screened at least once per year for:  HIV.  Urethral, rectal, and pharyngeal infection of gonorrhea, chlamydia, or both.  If you are at risk of being infected with HIV, it is recommended that you take a prescription medicine daily to prevent HIV infection. This is called preexposure prophylaxis (PrEP). You are considered at risk if:  You are a man who has sex with other men (MSM) and have other risk factors.  You are a heterosexual man, are sexually active, and are at increased risk for HIV infection.  You take drugs by injection.  You are sexually active with a partner who has HIV.  Talk with your health care provider about whether you are at high  risk of being infected with HIV. If you choose to begin PrEP, you should first be tested for HIV. You should then be tested every 3 months for as long as you are taking PrEP.  A one-time screening for abdominal aortic aneurysm (AAA) and surgical repair of large AAAs by ultrasound are recommended for men ages 20 to 55 years who are current or former smokers.  Healthy men should no longer receive prostate-specific antigen (PSA) blood tests as part of routine cancer screening. Talk with your health care provider about prostate cancer screening.  Testicular cancer screening is not recommended for adult males who have no symptoms. Screening includes self-exam, a health care provider exam, and other screening tests. Consult with your health care provider about any symptoms you have or any concerns you have about testicular cancer.  Use sunscreen. Apply sunscreen liberally and repeatedly throughout the day. You should seek shade when your shadow is shorter than you. Protect yourself by wearing long sleeves, pants, a wide-brimmed hat, and sunglasses year round, whenever you are outdoors.  Once a month, do a whole-body skin exam, using a mirror to look at the skin on your back. Tell your health care provider about new moles, moles that have irregular borders, moles that are larger than a pencil eraser, or moles that have changed in shape or color.  Stay current with required vaccines (immunizations).  Influenza vaccine. All adults should be immunized every year.  Tetanus, diphtheria, and acellular pertussis (Td, Tdap) vaccine. An adult who has not previously received Tdap or who does not know his vaccine status should receive 1 dose of Tdap. This initial dose should be followed by tetanus and diphtheria toxoids (Td) booster doses every 10 years. Adults with an unknown or incomplete history of completing a 3-dose immunization series with Td-containing vaccines should begin or complete a primary immunization  series including a Tdap dose. Adults should receive a Td booster every 10 years.  Varicella vaccine. An adult without evidence of immunity to varicella should receive 2 doses or a second dose if he has previously received 1 dose.  Human papillomavirus (HPV) vaccine. Males aged 11-21 years who have not received the vaccine previously should receive the 3-dose series. Males aged 22-26 years may be immunized. Immunization is recommended through the age of 65 years for any male who has sex with males and did not get any or all doses earlier. Immunization is recommended for any person with an immunocompromised  condition through the age of 51 years if he did not get any or all doses earlier. During the 3-dose series, the second dose should be obtained 4-8 weeks after the first dose. The third dose should be obtained 24 weeks after the first dose and 16 weeks after the second dose.  Zoster vaccine. One dose is recommended for adults aged 30 years or older unless certain conditions are present.  Measles, mumps, and rubella (MMR) vaccine. Adults born before [birth year] generally are considered immune to measles and mumps. Adults born in [birth year] or later should have 1 or more doses of MMR vaccine unless there is a contraindication to the vaccine or there is laboratory evidence of immunity to each of the three diseases. A routine second dose of MMR vaccine should be obtained at least 28 days after the first dose for students attending postsecondary schools, health care workers, or international travelers. People who received inactivated measles vaccine or an unknown type of measles vaccine during 1963-1967 should receive 2 doses of MMR vaccine. People who received inactivated mumps vaccine or an unknown type of mumps vaccine before 1979 and are at high risk for mumps infection should consider immunization with 2 doses of MMR vaccine. Unvaccinated health care workers born before [birth year] who lack laboratory evidence of measles,  mumps, or rubella immunity or laboratory confirmation of disease should consider measles and mumps immunization with 2 doses of MMR vaccine or rubella immunization with 1 dose of MMR vaccine.  Pneumococcal 13-valent conjugate (PCV13) vaccine. When indicated, a person who is uncertain of his immunization history and has no record of immunization should receive the PCV13 vaccine. All adults 39 years of age and older should receive this vaccine. An adult aged 62 years or older who has certain medical conditions and has not been previously immunized should receive 1 dose of PCV13 vaccine. This PCV13 should be followed with a dose of pneumococcal polysaccharide (PPSV23) vaccine. Adults who are at high risk for pneumococcal disease should obtain the PPSV23 vaccine at least 8 weeks after the dose of PCV13 vaccine. Adults older than 53 years of age who have normal immune system function should obtain the PPSV23 vaccine dose at least 1 year after the dose of PCV13 vaccine.  Pneumococcal polysaccharide (PPSV23) vaccine. When PCV13 is also indicated, PCV13 should be obtained first. All adults aged 63 years and older should be immunized. An adult younger than age 20 years who has certain medical conditions should be immunized. Any person who resides in a nursing home or long-term care facility should be immunized. An adult smoker should be immunized. People with an immunocompromised condition and certain other conditions should receive both PCV13 and PPSV23 vaccines. People with human immunodeficiency virus (HIV) infection should be immunized as soon as possible after diagnosis. Immunization during chemotherapy or radiation therapy should be avoided. Routine use of PPSV23 vaccine is not recommended for American Indians, [place] Natives, or people younger than 65 years unless there are medical conditions that require PPSV23 vaccine. When indicated, people who have unknown immunization and have no record of immunization should  receive PPSV23 vaccine. One-time revaccination 5 years after the first dose of PPSV23 is recommended for people aged 19-64 years who have chronic kidney failure, nephrotic syndrome, asplenia, or immunocompromised conditions. People who received 1-2 doses of PPSV23 before age 52 years should receive another dose of PPSV23 vaccine at age 13 years or later if at least 5 years have passed since the previous dose. Doses of PPSV23 are not  needed for people immunized with PPSV23 at or after age 19 years.  Meningococcal vaccine. Adults with asplenia or persistent complement component deficiencies should receive 2 doses of quadrivalent meningococcal conjugate (MenACWY-D) vaccine. The doses should be obtained at least 2 months apart. Microbiologists working with certain meningococcal bacteria, [place] recruits, people at risk during an outbreak, and people who travel to or live in countries with a high rate of meningitis should be immunized. A first-year college student up through age 70 years who is living in a residence hall should receive a dose if he did not receive a dose on or after his 16th birthday. Adults who have certain high-risk conditions should receive one or more doses of vaccine.  Hepatitis A vaccine. Adults who wish to be protected from this disease, have chronic liver disease, work with hepatitis A-infected animals, work in hepatitis A research labs, or travel to or work in countries with a high rate of hepatitis A should be immunized. Adults who were previously unvaccinated and who anticipate close contact with an international adoptee during the first 60 days after arrival in the [place] from a country with a high rate of hepatitis A should be immunized.  Hepatitis B vaccine. Adults should be immunized if they wish to be protected from this disease, are under age 35 years and have diabetes, have chronic liver disease, have had more than one sex partner in the past 6 months, may be exposed to  blood or other infectious body fluids, are household contacts or sex partners of hepatitis B positive people, are clients or workers in certain care facilities, or travel to or work in countries with a high rate of hepatitis B.  Haemophilus influenzae type b (Hib) vaccine. A previously unvaccinated person with asplenia or sickle cell disease or having a scheduled splenectomy should receive 1 dose of Hib vaccine. Regardless of previous immunization, a recipient of a hematopoietic stem cell transplant should receive a 3-dose series 6-12 months after his successful transplant. Hib vaccine is not recommended for adults with HIV infection. Preventive Service / Frequency Ages 79 to 67  Blood pressure check.** / Every 3-5 years.  Lipid and cholesterol check.** / Every 5 years beginning at age 47.  Hepatitis C blood test.** / For any individual with known risks for hepatitis C.  Skin self-exam. / Monthly.  Influenza vaccine. / Every year.  Tetanus, diphtheria, and acellular pertussis (Tdap, Td) vaccine.** / Consult your health care provider. 1 dose of Td every 10 years.  Varicella vaccine.** / Consult your health care provider.  HPV vaccine. / 3 doses over 6 months, if 51 or younger.  Measles, mumps, rubella (MMR) vaccine.** / You need at least 1 dose of MMR if you were born in 1957 or later. You may also need a second dose.  Pneumococcal 13-valent conjugate (PCV13) vaccine.** / Consult your health care provider.  Pneumococcal polysaccharide (PPSV23) vaccine.** / 1 to 2 doses if you smoke cigarettes or if you have certain conditions.  Meningococcal vaccine.** / 1 dose if you are age 59 to 28 years and a [employer] researcher living in a residence hall, or have one of several medical conditions. You may also need additional booster doses.  Hepatitis A vaccine.** / Consult your health care provider.  Hepatitis B vaccine.** / Consult your health care provider.  Haemophilus influenzae type  b (Hib) vaccine.** / Consult your health care provider. Ages 36 to 34  Blood pressure check.** / Every year.  Lipid and cholesterol  check.** / Every 5 years beginning at age 63.  Lung cancer screening. / Every year if you are aged 88-80 years and have a 30-pack-year history of smoking and currently smoke or have quit within the past 15 years. Yearly screening is stopped once you have quit smoking for at least 15 years or develop a health problem that would prevent you from having lung cancer treatment.  Fecal occult blood test (FOBT) of stool. / Every year beginning at age 40 and continuing until age 78. You may not have to do this test if you get a colonoscopy every 10 years.  Flexible sigmoidoscopy** or colonoscopy.** / Every 5 years for a flexible sigmoidoscopy or every 10 years for a colonoscopy beginning at age 17 and continuing until age 75.  Hepatitis C blood test.** / For all people born from [birth year] through 1965 and any individual with known risks for hepatitis C.  Skin self-exam. / Monthly.  Influenza vaccine. / Every year.  Tetanus, diphtheria, and acellular pertussis (Tdap/Td) vaccine.** / Consult your health care provider. 1 dose of Td every 10 years.  Varicella vaccine.** / Consult your health care provider.  Zoster vaccine.** / 1 dose for adults aged 69 years or older.  Measles, mumps, rubella (MMR) vaccine.** / You need at least 1 dose of MMR if you were born in 1958 or later. You may also need a second dose.  Pneumococcal 13-valent conjugate (PCV13) vaccine.** / Consult your health care provider.  Pneumococcal polysaccharide (PPSV23) vaccine.** / 1 to 2 doses if you smoke cigarettes or if you have certain conditions.  Meningococcal vaccine.** / Consult your health care provider.  Hepatitis A vaccine.** / Consult your health care provider.  Hepatitis B vaccine.** / Consult your health care provider.  Haemophilus influenzae type b (Hib) vaccine.** / Consult your  health care provider. Ages 50 and over  Blood pressure check.** / Every year.  Lipid and cholesterol check.**/ Every 5 years beginning at age 44.  Lung cancer screening. / Every year if you are aged 45-80 years and have a 30-pack-year history of smoking and currently smoke or have quit within the past 15 years. Yearly screening is stopped once you have quit smoking for at least 15 years or develop a health problem that would prevent you from having lung cancer treatment.  Fecal occult blood test (FOBT) of stool. / Every year beginning at age 50 and continuing until age 58. You may not have to do this test if you get a colonoscopy every 10 years.  Flexible sigmoidoscopy** or colonoscopy.** / Every 5 years for a flexible sigmoidoscopy or every 10 years for a colonoscopy beginning at age 50 and continuing until age 33.  Hepatitis C blood test.** / For all people born from [birth year] through 1966 and any individual with known risks for hepatitis C.  Abdominal aortic aneurysm (AAA) screening.** / A one-time screening for ages 9 to 78 years who are current or former smokers.  Skin self-exam. / Monthly.  Influenza vaccine. / Every year.  Tetanus, diphtheria, and acellular pertussis (Tdap/Td) vaccine.** / 1 dose of Td every 10 years.  Varicella vaccine.** / Consult your health care provider.  Zoster vaccine.** / 1 dose for adults aged 32 years or older.  Pneumococcal 13-valent conjugate (PCV13) vaccine.** / 1 dose for all adults aged 55 years and older.  Pneumococcal polysaccharide (PPSV23) vaccine.** / 1 dose for all adults aged 63 years and older.  Meningococcal vaccine.** / Consult your health care provider.  Hepatitis  A vaccine.** / Consult your health care provider.  Hepatitis B vaccine.** / Consult your health care provider.  Haemophilus influenzae type b (Hib) vaccine.** / Consult your health care provider. **Family history and personal history of risk and conditions may change your  health care provider's recommendations.   This information is not intended to replace advice given to you by your health care provider. Make sure you discuss any questions you have with your health care provider.   Document Released: 06/19/2001 Document Revised: 05/14/2014 Document Reviewed: 09/18/2010 Elsevier Interactive Patient Education [employer].

## 2017-10-03 LAB — COMPREHENSIVE METABOLIC PANEL
ALT: 19 IU/L (ref 0–44)
AST: 18 IU/L (ref 0–40)
Albumin/Globulin Ratio: 1.9 (ref 1.2–2.2)
Albumin: 4.4 g/dL (ref 3.5–5.5)
Alkaline Phosphatase: 119 IU/L — ABNORMAL HIGH (ref 39–117)
BUN/Creatinine Ratio: 26 — ABNORMAL HIGH (ref 9–20)
BUN: 22 mg/dL (ref 6–24)
Bilirubin Total: 0.2 mg/dL (ref 0.0–1.2)
CALCIUM: 9.1 mg/dL (ref 8.7–10.2)
CO2: 21 mmol/L (ref 20–29)
CREATININE: 0.85 mg/dL (ref 0.76–1.27)
Chloride: 102 mmol/L (ref 96–106)
GFR, EST AFRICAN AMERICAN: 115 mL/min/{1.73_m2} (ref >59)
GFR, EST NON AFRICAN AMERICAN: 99 mL/min/{1.73_m2} (ref >59)
Globulin, Total: 2.3 g/dL (ref 1.5–4.5)
Glucose: 100 mg/dL — ABNORMAL HIGH (ref 65–99)
Potassium: 4.1 mmol/L (ref 3.5–5.2)
Sodium: 138 mmol/L (ref 134–144)
TOTAL PROTEIN: 6.7 g/dL (ref 6.0–8.5)

## 2017-10-03 LAB — CBC WITH DIFFERENTIAL/PLATELET
BASOS: 1 %
Basophils Absolute: 0.1 10*3/uL (ref 0.0–0.2)
EOS (ABSOLUTE): 0.1 10*3/uL (ref 0.0–0.4)
EOS: 1 %
HEMATOCRIT: 44.1 % (ref 37.5–51.0)
Hemoglobin: 15.3 g/dL (ref 13.0–17.7)
IMMATURE GRANS (ABS): 0 10*3/uL (ref 0.0–0.1)
IMMATURE GRANULOCYTES: 0 %
LYMPHS: 19 %
Lymphocytes Absolute: 1.5 10*3/uL (ref 0.7–3.1)
MCH: 31.7 pg (ref 26.6–33.0)
MCHC: 34.7 g/dL (ref 31.5–35.7)
MCV: 91 fL (ref 79–97)
MONOS ABS: 0.7 10*3/uL (ref 0.1–0.9)
Monocytes: 9 %
NEUTROS PCT: 70 %
Neutrophils Absolute: 5.6 10*3/uL (ref 1.4–7.0)
PLATELETS: 220 10*3/uL (ref 150–450)
RBC: 4.83 x10E6/uL (ref 4.14–5.80)
RDW: 13.8 % (ref 12.3–15.4)
WBC: 8.1 10*3/uL (ref 3.4–10.8)

## 2017-10-03 LAB — LIPID PANEL
CHOLESTEROL TOTAL: 223 mg/dL — AB (ref 100–199)
Chol/HDL Ratio: 5.7 ratio — ABNORMAL HIGH (ref 0.0–5.0)
HDL: 39 mg/dL — ABNORMAL LOW (ref >39)
LDL Calculated: 156 mg/dL — ABNORMAL HIGH (ref 0–99)
TRIGLYCERIDES: 139 mg/dL (ref 0–149)
VLDL CHOLESTEROL CAL: 28 mg/dL (ref 5–40)

## 2017-10-03 LAB — VITAMIN D 25 HYDROXY (VIT D DEFICIENCY, FRACTURES): VIT D 25 HYDROXY: 36.4 ng/mL (ref 30.0–100.0)

## 2017-10-03 LAB — VITAMIN B12: VITAMIN B 12: 388 pg/mL (ref 232–1245)

## 2017-10-03 LAB — TESTOSTERONE,FREE AND TOTAL
Testosterone, Free: 7.4 pg/mL (ref 7.2–24.0)
Testosterone: 412 ng/dL (ref 264–916)

## 2017-10-03 LAB — HEMOGLOBIN A1C
Est. average glucose Bld gHb Est-mCnc: 117 mg/dL
Hgb A1c MFr Bld: 5.7 % — ABNORMAL HIGH (ref 4.8–5.6)

## 2017-10-03 LAB — TSH: TSH: 1.34 u[IU]/mL (ref 0.450–4.500)

## 2017-10-03 LAB — T4, FREE: Free T4: 1.23 ng/dL (ref 0.82–1.77)

## 2017-10-21 ENCOUNTER — Other Ambulatory Visit: Payer: Self-pay

## 2017-10-21 DIAGNOSIS — F39 Unspecified mood [affective] disorder: Secondary | ICD-10-CM

## 2017-10-21 DIAGNOSIS — F4323 Adjustment disorder with mixed anxiety and depressed mood: Secondary | ICD-10-CM

## 2017-10-21 DIAGNOSIS — M159 Polyosteoarthritis, unspecified: Secondary | ICD-10-CM

## 2017-10-21 MED ORDER — LAMOTRIGINE 100 MG PO TABS: 50.0000 mg | ORAL_TABLET | Freq: Two times a day (BID) | ORAL | 1 refills | Status: DC

## 2017-10-21 MED ORDER — MELOXICAM 15 MG PO TABS: 15.0000 mg | ORAL_TABLET | Freq: Every day | ORAL | 1 refills | Status: DC

## 2017-10-21 NOTE — Telephone Encounter (Signed)
Refills on Lamotrigine and meloxicam - chart reviewed and refills sent into the pharmacy. MPulliam, CMA/RT(R)

## 2017-12-02 ENCOUNTER — Other Ambulatory Visit: Payer: Self-pay

## 2017-12-02 DIAGNOSIS — E782 Mixed hyperlipidemia: Secondary | ICD-10-CM

## 2017-12-02 DIAGNOSIS — E781 Pure hyperglyceridemia: Secondary | ICD-10-CM

## 2017-12-02 MED ORDER — NIACIN ER 500 MG PO CPCR: 500.0000 mg | ORAL_CAPSULE | Freq: Two times a day (BID) | ORAL | 1 refills | Status: DC

## 2017-12-02 MED ORDER — ATORVASTATIN CALCIUM 80 MG PO TABS: ORAL_TABLET | ORAL | 1 refills | Status: DC

## 2017-12-02 NOTE — Progress Notes (Signed)
Refills MPulliam, CMA/RT(R)  

## 2017-12-06 ENCOUNTER — Other Ambulatory Visit: Payer: Self-pay

## 2017-12-06 DIAGNOSIS — F39 Unspecified mood [affective] disorder: Secondary | ICD-10-CM

## 2017-12-06 DIAGNOSIS — F4323 Adjustment disorder with mixed anxiety and depressed mood: Secondary | ICD-10-CM

## 2017-12-06 MED ORDER — LAMOTRIGINE 100 MG PO TABS: 50.0000 mg | ORAL_TABLET | Freq: Two times a day (BID) | ORAL | 1 refills | Status: DC

## 2017-12-06 NOTE — Progress Notes (Signed)
Refill. MPulliam, CMA/RT(R)  

## 2018-02-06 ENCOUNTER — Ambulatory Visit: Payer: Self-pay | Admitting: Family Medicine

## 2018-03-07 ENCOUNTER — Ambulatory Visit (INDEPENDENT_AMBULATORY_CARE_PROVIDER_SITE_OTHER): Payer: BLUE CROSS/BLUE SHIELD | Admitting: Family Medicine

## 2018-03-07 ENCOUNTER — Encounter: Payer: Self-pay | Admitting: Family Medicine

## 2018-03-07 VITALS — BP 132/74 | HR 57 | Temp 97.6°F | Ht 69.0 in | Wt 200.3 lb

## 2018-03-07 DIAGNOSIS — E786 Lipoprotein deficiency: Secondary | ICD-10-CM

## 2018-03-07 DIAGNOSIS — F39 Unspecified mood [affective] disorder: Secondary | ICD-10-CM | POA: Diagnosis not present

## 2018-03-07 DIAGNOSIS — Z716 Tobacco abuse counseling: Secondary | ICD-10-CM

## 2018-03-07 DIAGNOSIS — Z23 Encounter for immunization: Secondary | ICD-10-CM

## 2018-03-07 DIAGNOSIS — E669 Obesity, unspecified: Secondary | ICD-10-CM

## 2018-03-07 DIAGNOSIS — Z720 Tobacco use: Secondary | ICD-10-CM

## 2018-03-07 DIAGNOSIS — I10 Essential (primary) hypertension: Secondary | ICD-10-CM | POA: Diagnosis not present

## 2018-03-07 DIAGNOSIS — R7302 Impaired glucose tolerance (oral): Secondary | ICD-10-CM

## 2018-03-07 DIAGNOSIS — E559 Vitamin D deficiency, unspecified: Secondary | ICD-10-CM

## 2018-03-07 DIAGNOSIS — E785 Hyperlipidemia, unspecified: Secondary | ICD-10-CM

## 2018-03-07 NOTE — Patient Instructions (Addendum)
Contact us in 3-4 weeks regarding what home blood pressures have been running. The goal for your blood pressure would be 120/70's.   Very please check your blood pressure at home at least Monday morning, Wednesday afternoon and Friday evenings.  Please keep a log and write it down.  Please make sure you do not smoke or drink caffeinated beverages within 20-30 minutes and you are sitting quietly for at least 15 to 20 minutes.  Please contact us in 4 weeks via my chart and let us know what your blood pressures are running if need be we can as add amlodipine   Steps to Quit Smoking Smoking tobacco can be harmful to your health and can affect almost every organ in your body. Smoking puts you, and those around you, at risk for developing many serious chronic diseases. Quitting smoking is difficult, but it is one of the best things that you can do for your health. It is never too late to quit. What are the benefits of quitting smoking? When you quit smoking, you lower your risk of developing serious diseases and conditions, such as:  Lung cancer or lung disease, such as COPD.  Heart disease.  Stroke.  Heart attack.  Infertility.  Osteoporosis and bone fractures.  Additionally, symptoms such as coughing, wheezing, and shortness of breath may get better when you quit. You may also find that you get sick less often because your body is stronger at fighting off colds and infections. If you are pregnant, quitting smoking can help to reduce your chances of having a baby of low birth weight. How do I get ready to quit? When you decide to quit smoking, create a plan to make sure that you are successful. Before you quit:  Pick a date to quit. Set a date within the next two weeks to give you time to prepare.  Write down the reasons why you are quitting. Keep this list in places where you will see it often, such as on your bathroom mirror or in your car or wallet.  Identify the people, places, things,  and activities that make you want to smoke (triggers) and avoid them. Make sure to take these actions: ? Throw away all cigarettes at home, at work, and in your car. ? Throw away smoking accessories, such as Set designer. ? Clean your car and make sure to empty the ashtray. ? Clean your home, including curtains and carpets.  Tell your family, friends, and coworkers that you are quitting. Support from your loved ones can make quitting easier.  Talk with your health care provider about your options for quitting smoking.  Find out what treatment options are covered by your health insurance.  What strategies can I use to quit smoking? Talk with your healthcare provider about different strategies to quit smoking. Some strategies include:  Quitting smoking altogether instead of gradually lessening how much you smoke over a period of time. Research shows that quitting cold [place] is more successful than gradually quitting.  Attending in-person counseling to help you build problem-solving skills. You are more likely to have success in quitting if you attend several counseling sessions. Even short sessions of 10 minutes can be effective.  Finding resources and support systems that can help you to quit smoking and remain smoke-free after you quit. These resources are most helpful when you use them often. They can include: ? Online chats with a Veterinary surgeon. ? Telephone quitlines. ? Automotive engineer. ? Support groups or group  counseling. ? Text messaging programs. ? Mobile phone applications.  Taking medicines to help you quit smoking. (If you are pregnant or breastfeeding, talk with your health care provider first.) Some medicines contain nicotine and some do not. Both types of medicines help with cravings, but the medicines that include nicotine help to relieve withdrawal symptoms. Your health care provider may recommend: ? Nicotine patches, gum, or lozenges. ? Nicotine  inhalers or sprays. ? Non-nicotine medicine that is taken by mouth.  Talk with your health care provider about combining strategies, such as taking medicines while you are also receiving in-person counseling. Using these two strategies together makes you more likely to succeed in quitting than if you used either strategy on its own. If you are pregnant or breastfeeding, talk with your health care provider about finding counseling or other support strategies to quit smoking. Do not take medicine to help you quit smoking unless told to do so by your health care provider. What things can I do to make it easier to quit? Quitting smoking might feel overwhelming at first, but there is a lot that you can do to make it easier. Take these important actions:  Reach out to your family and friends and ask that they support and encourage you during this time. Call telephone quitlines, reach out to support groups, or work with a counselor for support.  Ask people who smoke to avoid smoking around you.  Avoid places that trigger you to smoke, such as bars, parties, or smoke-break areas at work.  Spend time around people who do not smoke.  Lessen stress in your life, because stress can be a smoking trigger for some people. To lessen stress, try: ? Exercising regularly. ? Deep-breathing exercises. ? Yoga. ? Meditating. ? Performing a body scan. This involves closing your eyes, scanning your body from head to toe, and noticing which parts of your body are particularly tense. Purposefully relax the muscles in those areas.  Download or purchase mobile phone or tablet apps (applications) that can help you stick to your quit plan by providing reminders, tips, and encouragement. There are many free apps, such as QuitGuide from the [employer] developer for Disease Control and Prevention). You can find other support for quitting smoking (smoking cessation) through smokefree.gov and other websites.  How will I feel when I  quit smoking? Within the first 24 hours of quitting smoking, you may start to feel some withdrawal symptoms. These symptoms are usually most noticeable 2-3 days after quitting, but they usually do not last beyond 2-3 weeks. Changes or symptoms that you might experience include:  Mood swings.  Restlessness, anxiety, or irritation.  Difficulty concentrating.  Dizziness.  Strong cravings for sugary foods in addition to nicotine.  Mild weight gain.  Constipation.  Nausea.  Coughing or a sore throat.  Changes in how your medicines work in your body.  A depressed mood.  Difficulty sleeping (insomnia).  After the first 2-3 weeks of quitting, you may start to notice more positive results, such as:  Improved sense of smell and taste.  Decreased coughing and sore throat.  Slower heart rate.  Lower blood pressure.  Clearer skin.  The ability to breathe more easily.  Fewer sick days.  Quitting smoking is very challenging for most people. Do not get discouraged if you are not successful the first time. Some people need to make many attempts to quit before they achieve long-term success. Do your best to stick to your quit plan, and talk with  your health care provider if you have any questions or concerns. This information is not intended to replace advice given to you by your health care provider. Make sure you discuss any questions you have with your health care provider. Document Released: 04/17/2001 Document Revised: 12/20/2015 Document Reviewed: 09/07/2014 Elsevier Interactive Patient Education  [employer].

## 2018-03-07 NOTE — Progress Notes (Signed)
Impression and Recommendations:    1. Essential hypertension   2. Hyperlipidemia, unspecified hyperlipidemia type   3. Low level of high density lipoprotein (HDL)   4. Mood disorder (HCC)   5. Glucose intolerance (impaired glucose tolerance)   6. Tobacco abuse   7. Tobacco abuse counseling   8. Obesity (BMI 30-39.9)   9. Vitamin D deficiency      Essential hypertension  Hyperlipidemia, unspecified hyperlipidemia type  Low level of high density lipoprotein (HDL)  Mood disorder (HCC)  Glucose intolerance (impaired glucose tolerance)  Tobacco abuse  Tobacco abuse counseling  Obesity (BMI 30-39.9)  Vitamin D deficiency  HTN:  -Patient is on Hyzaar, which he tolerates well  -Repeat blood pressure was 132/74 on left arm in office today  -Advised that the goal would be to have his blood pressure remained at 120/70.   HLD:  -Patient continues on niacin with good tolerance.   -Discussed and advised the patient regarding the use of lovaza fish oil, he will start taking lovaza fish oil to aid with hyperlipidemia.   Mood:  -Patient continues on lamictal and zoloft with good tolerance.   Tobacco use:  Told pt to think seriously about quitting smoking!  Told pt it is very important for his/her health and well being.    Smoking cessation instruction/counseling given:  counseled patient on the dangers of tobacco use, advised patient to stop smoking, and reviewed strategies to maximize success  Discussed with patient that there are multiple treatments to aid in quitting smoking, however I explained none will work unless pt really want to quit  Told to call 1-800-QUIT-NOW 434-445-8194) for free smoking cessation counseling and support, or pt can go online to www.heart.org - the American Heart Association website and search "quit smoking ".    Health Maintenance:  Discussed with the patient today regarding Colo-Guard vs colonoscopy. Patient will follow up regarding  this.     Education and routine counseling performed. Handouts provided.   The patient was counseled, risk factors were discussed, anticipatory guidance given.  Gross side effects, risk and benefits, and alternatives of medications discussed with patient.  Patient is aware that all medications have potential side effects and we are unable to predict every side effect or drug-drug interaction that may occur.  Expresses verbal understanding and consents to current therapy plan and treatment regimen.  Return for (2) 17mo f/up BP- bring log.  Please see AVS handed out to patient at the end of our visit for further patient instructions/ counseling done pertaining to today's office visit.    Note:  This document was prepared using Dragon voice recognition software and may include unintentional dictation errors.  This document serves as a record of services personally performed by Thomasene Lot, DO. It was created on her behalf by Chestine Spore, a trained medical scribe. The creation of this record is based on the scribe's personal observations and the provider's statements to them.   I have reviewed the above medical documentation for accuracy and completeness and I concur.  Thomasene Lot, D.O.     Subjective:    HPI: Aaron Cooper is a 52 y.o. male who presents to [facility] today for follow up of CHRONIC CONDITIONS.    His mother died recently and he was up in [place] for 3 months with his mother prior to her passing.   HTN:  -  His blood pressure has not been controlled at home. Pt  has not been checking it regularly.  - Patient reports good compliance with blood pressure medications (Hyzaar)  - Denies medication S-E   - Smoking Status noted   - He denies new onset of: chest pain, exercise intolerance, shortness of breath, dizziness, visual changes, headache, lower extremity swelling or claudication.    Mood:  He is doing well on the lamictal  and zoloft and when he is missing the dose, he has significant mood changes. Symptoms are well controlled when he is on this medication.   HLD:  He continues on Niacin with good tolerance.   Vitamin D:  He continues on this medication with good tolerance.    Last 3 blood pressure readings in our office are as follows: BP Readings from Last 3 Encounters:  03/07/18 132/74  10/02/17 126/82  06/03/17 121/80    Pulse Readings from Last 3 Encounters:  03/07/18 (!) 57  10/02/17 74  06/03/17 77    Filed Weights   03/07/18 1051  Weight: 200 lb 4.8 oz (90.9 kg)      Patient Care Team    Relationship Specialty Notifications Start End  Thomasene Lot, DO PCP - General Family Medicine  11/09/15      Lab Results  Component Value Date   CREATININE 0.85 10/02/2017   BUN 22 10/02/2017   NA 138 10/02/2017   K 4.1 10/02/2017   CL 102 10/02/2017   CO2 21 10/02/2017    Lab Results  Component Value Date   CHOL 223 (H) 10/02/2017   CHOL 243 (H) 06/03/2017   CHOL 234 (H) 12/21/2016    Lab Results  Component Value Date   HDL 39 (L) 10/02/2017   HDL 43 06/03/2017   HDL 39 (L) 12/21/2016    Lab Results  Component Value Date   LDLCALC 156 (H) 10/02/2017   LDLCALC 169 (H) 06/03/2017   LDLCALC 165 (H) 12/21/2016    Lab Results  Component Value Date   TRIG 139 10/02/2017   TRIG 156 (H) 06/03/2017   TRIG 150 (H) 12/21/2016    Lab Results  Component Value Date   CHOLHDL 5.7 (H) 10/02/2017   CHOLHDL 5.7 (H) 06/03/2017   CHOLHDL 6.0 (H) 12/21/2016    No results found for: LDLDIRECT ===================================================================   Patient Active Problem List   Diagnosis Date Noted  . HTN (hypertension) 11/09/2015    Priority: High  . HLD (hyperlipidemia) 11/09/2015    Priority: High  . Mood disorder (HCC) 11/09/2015    Priority: Medium  . Vitamin D deficiency 11/09/2015    Priority: Medium  . Tobacco abuse 11/20/2016    Priority: Low  .  Tobacco abuse counseling 11/20/2016    Priority: Low  . Glucose intolerance (impaired glucose tolerance) 03/14/2018  . Low level of high density lipoprotein (HDL) 03/14/2018  . Occupational exposure to chemicals-patient served at [employer] from 1984 through 87 06/03/2017  . Contact with and (suspected) exposure to other hazardous, chiefly nonmedicinal, chemicals 06/03/2017  . Adjustment disorder with mixed anxiety and depressed mood 02/14/2016  . Fatigue 11/09/2015  . Generalized osteoarthritis of multiple sites 11/09/2015  . 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 Obesity (BMI 30-39.9) 11/09/2015     Past Medical History:  Diagnosis Date  . Arthritis   . Depression   . Fatigue 11/09/2015  . HLD (hyperlipidemia) 11/09/2015  . HTN (hypertension) 11/09/2015  . Mood disorder in conditions classified elsewhere 11/09/2015  . Obesity (BMI 30-39.9) 11/09/2015  . Overweight (BMI 25.0-29.9) 11/09/2015  . Vitamin  D insufficiency 11/09/2015     Past Surgical History:  Procedure Laterality Date  . BACK SURGERY  2013  . HAND SURGERY Right   . KNEE SURGERY Right 1995     Family History  Problem Relation Age of Onset  . Diabetes Mother   . Hyperlipidemia Mother   . Heart attack Father   . Hyperlipidemia Father   . Hypertension Father   . Cancer Sister        breast  . Stroke Maternal Aunt   . Alcohol abuse Maternal Grandmother   . Heart attack Brother   . Alcohol abuse Brother      Social History   Substance and Sexual Activity  Drug Use No  ,  Social History   Substance and Sexual Activity  Alcohol Use No  ,  Social History   Tobacco Use  Smoking Status Current Every Day Smoker  . Packs/day: 1.50  . Years: 25.00  . Pack years: 37.50  Smokeless Tobacco Never Used  Tobacco Comment   Pt refused cessation material  ,    Current Outpatient Medications on File Prior to Visit  Medication Sig Dispense Refill  . atorvastatin (LIPITOR) 80 MG tablet Take 80 mg nightly before bedtime.  90 tablet 1  . lamoTRIgine (LAMICTAL) 100 MG tablet Take 0.5 tablets (50 mg total) by mouth 2 (two) times daily. 90 tablet 1  . losartan-hydrochlorothiazide (HYZAAR) 100-12.5 MG tablet Take 1 tablet by mouth daily. 90 tablet 1  . meloxicam (MOBIC) 15 MG tablet Take 1 tablet (15 mg total) by mouth daily. 90 tablet 1  . niacin 500 MG CR capsule Take 1 capsule (500 mg total) by mouth 2 (two) times daily with a meal. 180 capsule 1  . sertraline (ZOLOFT) 50 MG tablet Take 1 tablet (50 mg total) by mouth daily. 90 tablet 1  . Vitamin D, Ergocalciferol, (DRISDOL) 50000 units CAPS capsule Take one tablet wkly 12 capsule 10   No current facility-administered medications on file prior to visit.      No Known Allergies   Review of Systems:   General:  Denies fever, chills Optho/Auditory:   Denies visual changes, blurred vision Respiratory:   Denies SOB, cough, wheeze, DIB  Cardiovascular:   Denies chest pain, palpitations, painful respirations Gastrointestinal:   Denies nausea, vomiting, diarrhea.  Endocrine:     Denies new hot or cold intolerance Musculoskeletal:  Denies joint swelling, gait issues, or new unexplained myalgias/ arthralgias Skin:  Denies rash, suspicious lesions  Neurological:    Denies dizziness, unexplained weakness, numbness  Psychiatric/Behavioral:   Denies mood changes  Objective:    Blood pressure 132/74, pulse (!) 57, temperature 97.6 F (36.4 C), height 5\' 9"  (1.753 m), weight 200 lb 4.8 oz (90.9 kg), SpO2 98 %.  Body mass index is 29.58 kg/m.  General: Well Developed, well nourished, and in no acute distress.  HEENT: Normocephalic, atraumatic, pupils equal round reactive to light, neck supple, No carotid bruits, no JVD Skin: Warm and dry, cap RF less 2 sec Cardiac: Regular rate and rhythm, S1, S2 WNL's, no murmurs rubs or gallops Respiratory: ECTA B/L, Not using accessory muscles, speaking in full sentences. NeuroM-Sk: Ambulates w/o assistance, moves ext * 4 w/o  difficulty, sensation grossly intact.  Ext: scant edema b/l lower ext Psych: No HI/SI, judgement and insight good, Euthymic mood. Full Affect.

## 2018-03-14 DIAGNOSIS — E786 Lipoprotein deficiency: Secondary | ICD-10-CM | POA: Insufficient documentation

## 2018-03-14 DIAGNOSIS — R7302 Impaired glucose tolerance (oral): Secondary | ICD-10-CM | POA: Insufficient documentation

## 2018-03-21 NOTE — Addendum Note (Signed)
Addended by: Leda MinPULLIAM, Luma Clopper D on: 03/21/2018 12:54 PM   Modules accepted: Orders

## 2018-05-12 ENCOUNTER — Other Ambulatory Visit: Payer: Self-pay

## 2018-05-12 DIAGNOSIS — M159 Polyosteoarthritis, unspecified: Secondary | ICD-10-CM

## 2018-05-12 DIAGNOSIS — F39 Unspecified mood [affective] disorder: Secondary | ICD-10-CM

## 2018-05-12 DIAGNOSIS — F4323 Adjustment disorder with mixed anxiety and depressed mood: Secondary | ICD-10-CM

## 2018-05-12 MED ORDER — MELOXICAM 15 MG PO TABS
15.0000 mg | ORAL_TABLET | Freq: Every day | ORAL | 1 refills | Status: DC
Start: 2018-05-12 — End: 2019-03-24

## 2018-05-12 MED ORDER — LAMOTRIGINE 100 MG PO TABS: 50.0000 mg | ORAL_TABLET | Freq: Two times a day (BID) | ORAL | 1 refills | Status: DC

## 2018-05-12 NOTE — Telephone Encounter (Signed)
Pharmacy sent refill request foe meloxicam and lamotrigine.  Reviewed chart and sent in refills per office policy. MPulliam, CMA/RT(R)

## 2018-06-05 ENCOUNTER — Other Ambulatory Visit: Payer: Self-pay

## 2018-06-05 DIAGNOSIS — E782 Mixed hyperlipidemia: Secondary | ICD-10-CM

## 2018-06-05 MED ORDER — ATORVASTATIN CALCIUM 80 MG PO TABS: ORAL_TABLET | ORAL | 1 refills | Status: DC

## 2018-06-05 NOTE — Telephone Encounter (Signed)
Pharmacy sent over refill request for atrovastatin, reviewed chart and sent in refills per office policy. MPulliam, CMA/RT(R)

## 2018-06-18 ENCOUNTER — Other Ambulatory Visit: Payer: Self-pay

## 2018-06-18 DIAGNOSIS — E559 Vitamin D deficiency, unspecified: Secondary | ICD-10-CM

## 2018-06-18 MED ORDER — VITAMIN D (ERGOCALCIFEROL) 1.25 MG (50000 UNIT) PO CAPS: ORAL_CAPSULE | ORAL | 10 refills | Status: DC

## 2018-06-20 ENCOUNTER — Ambulatory Visit (INDEPENDENT_AMBULATORY_CARE_PROVIDER_SITE_OTHER): Payer: BLUE CROSS/BLUE SHIELD | Admitting: Family Medicine

## 2018-06-20 ENCOUNTER — Encounter: Payer: Self-pay | Admitting: Family Medicine

## 2018-06-20 VITALS — BP 137/86 | HR 67 | Temp 97.5°F | Ht 69.0 in | Wt 221.3 lb

## 2018-06-20 DIAGNOSIS — E785 Hyperlipidemia, unspecified: Secondary | ICD-10-CM

## 2018-06-20 DIAGNOSIS — E559 Vitamin D deficiency, unspecified: Secondary | ICD-10-CM

## 2018-06-20 DIAGNOSIS — M159 Polyosteoarthritis, unspecified: Secondary | ICD-10-CM

## 2018-06-20 DIAGNOSIS — Z720 Tobacco use: Secondary | ICD-10-CM

## 2018-06-20 DIAGNOSIS — Z79899 Other long term (current) drug therapy: Secondary | ICD-10-CM | POA: Diagnosis not present

## 2018-06-20 DIAGNOSIS — F4323 Adjustment disorder with mixed anxiety and depressed mood: Secondary | ICD-10-CM

## 2018-06-20 DIAGNOSIS — I10 Essential (primary) hypertension: Secondary | ICD-10-CM

## 2018-06-20 DIAGNOSIS — R7302 Impaired glucose tolerance (oral): Secondary | ICD-10-CM

## 2018-06-20 DIAGNOSIS — F39 Unspecified mood [affective] disorder: Secondary | ICD-10-CM

## 2018-06-20 DIAGNOSIS — Z716 Tobacco abuse counseling: Secondary | ICD-10-CM

## 2018-06-20 MED ORDER — RANITIDINE HCL 75 MG PO TABS: 75.0000 mg | ORAL_TABLET | Freq: Two times a day (BID) | ORAL | Status: DC

## 2018-06-20 MED ORDER — LAMOTRIGINE 100 MG PO TABS: 50.0000 mg | ORAL_TABLET | Freq: Two times a day (BID) | ORAL | 1 refills | Status: DC

## 2018-06-20 MED ORDER — SERTRALINE HCL 100 MG PO TABS: 100.0000 mg | ORAL_TABLET | Freq: Every day | ORAL | 1 refills | Status: DC

## 2018-06-20 NOTE — Patient Instructions (Addendum)
Please look into something like the Calm App, Stress at Work, 10 minute meditations to help calm down, or listening to [facility] for intervals to help calm down.  Aaron Cooper please get ranitidine or famotidine or cimetidine over-the-counter medicine.   This is an acid blocker for your stomach to help protect it when you are taking the Mobic\ meloxicam every day!!      Stress Stress is a normal reaction to life events. Stress is what you feel when life demands more than you are used to, or more than you think you can handle. Some stress can be useful, such as studying for a test or meeting a deadline at work. Stress that occurs too often or for too long can cause problems. It can affect your emotional health and interfere with relationships and normal daily activities. Too much stress can weaken your body's defense system (immune system) and increase your risk for physical illness. If you already have a medical problem, stress can make it worse. What are the causes? All sorts of life events can cause stress. An event that causes stress for one person may not be stressful for another person. Major life events, whether positive or negative, commonly cause stress. Examples include:  Losing a job or starting a new job.  Losing a loved one.  Moving to a new town or home.  Getting married or divorced.  Having a baby.  Injury or illness. Less obvious life events can also cause stress, especially if they occur day after day or in combination with each other. Examples include:  Working long hours.  Driving in traffic.  Caring for children.  Being in debt.  Being in a difficult relationship. What are the signs or symptoms? Stress can cause emotional symptoms, including:  Anxiety. This is feeling worried, afraid, on edge, overwhelmed, or out of control.  Anger, including irritation or impatience.  Depression. This is feeling sad, down, helpless, or guilty.  Trouble focusing, remembering, or  making decisions. Stress can cause physical symptoms, including:  Aches and pains. These may affect your head, neck, back, stomach, or other areas of your body.  Tight muscles or a clenched jaw.  Low energy.  Trouble sleeping. Stress can cause unhealthy behaviors, including:  Eating to feel better (overeating) or skipping meals.  Working too much or putting off tasks.  Smoking, drinking alcohol, or using drugs to feel better. How is this diagnosed? Stress is diagnosed through an assessment by your health care provider. He or she may diagnose this condition based on:  Your symptoms and any stressful life events.  Your medical history.  Tests to rule out other causes of your symptoms. Depending on your condition, your health care provider may refer you to a specialist for further evaluation. How is this treated?  Stress management techniques are the recommended treatment for stress. Medicine is not typically recommended for the treatment of stress. Techniques to reduce your reaction to stressful life events include:  Stress identification. Monitor yourself for symptoms of stress and identify what causes stress for you. These skills may help you to avoid or prepare for stressful events.  Time management. Set your priorities, keep a calendar of events, and learn to say no. Taking these actions can help you avoid making too many commitments. Techniques for coping with stress include:  Rethinking the problem. Try to think realistically about stressful events rather than ignoring them or overreacting. Try to find the positives in a stressful situation rather than focusing on the negatives.  Exercise. Physical exercise can release both physical and emotional tension. The key is to find a form of exercise that you enjoy and do it regularly.  Relaxation techniques. These relax the body and mind. The key is to find one or more that you enjoy and use the technique(s) regularly. Examples  include: ? Meditation, deep breathing, or progressive relaxation techniques. ? Yoga or tai chi. ? Biofeedback, mindfulness techniques, or journaling. ? Listening to music, being out in nature, or participating in other hobbies.  Practicing a healthy lifestyle. Eat a balanced diet, drink plenty of water, limit or avoid caffeine, and get plenty of sleep.  Having a strong support network. Spend time with family, friends, or other people you enjoy being around. Express your feelings and talk things over with someone you trust. Counseling or talk therapy with a mental health professional may be helpful if you are having trouble managing stress on your own. Follow these instructions at home: Lifestyle   Avoid drugs.  Do not use any products that contain nicotine or tobacco, such as cigarettes and e-cigarettes. If you need help quitting, ask your health care provider.  Limit alcohol intake to no more than 1 drink a day for nonpregnant women and 2 drinks a day for men. One drink equals 12 oz of beer, 5 oz of wine, or 1 oz of hard liquor.  Do not use alcohol or drugs to relax.  Eat a balanced diet that includes fresh fruits and vegetables, whole grains, lean meats, fish, eggs, and beans, and low-fat dairy. Avoid processed foods and foods high in added fat, sugar, and salt.  Exercise at least 30 minutes on 5 or more days each week.  Get 7-8 hours of sleep each night. General instructions   Practice stress management techniques as discussed with your health care provider.  Drink enough fluid to keep your urine clear or pale yellow.  Take over-the-counter and prescription medicines only as told by your health care provider.  Keep all follow-up visits as told by your health care provider. This is important. Contact a health care provider if:  Your symptoms get worse.  You have new symptoms.  You feel overwhelmed by your problems and can no longer manage them on your own. Get help  right away if:  You have thoughts of hurting yourself or others. If you ever feel like you may hurt yourself or others, or have thoughts about taking your own life, get help right away. You can go to your nearest emergency department or call:  Your local emergency services (911 in the [place]  A suicide crisis helpline, such as the [place] at 360 857 0067. This is open 24 hours a day. Summary  Stress is a normal reaction to life events. It can cause problems if it happens too often or for too long.  Practicing stress management techniques is the best way to treat stress.  Counseling or talk therapy with a mental health professional may be helpful if you are having trouble managing stress on your own. This information is not intended to replace advice given to you by your health care provider. Make sure you discuss any questions you have with your health care provider. Document Released: 10/17/2000 Document Revised: 06/13/2016 Document Reviewed: 06/13/2016 Elsevier Interactive Patient Education  2019 [employer].

## 2018-06-20 NOTE — Progress Notes (Signed)
Impression and Recommendations:    1. High risk medication use   2. Adjustment disorder with mixed anxiety and depressed mood   3. Essential hypertension   4. Hyperlipidemia, unspecified hyperlipidemia type   5. Mood disorder ([place])   6. Tobacco abuse   7. Tobacco abuse counseling   8. Glucose intolerance (impaired glucose tolerance)   9. Generalized osteoarthritis of multiple sites   10. Vitamin D deficiency     - Need for full set of blood work in near future.  Patient is not fasting today.  1. Adjustment Disorder with Mixed Anxiety & Depressed Mood - Not optimally managed at this time. - Change in treatment plan recommended today.  - Advised increased dose of sertraline.  Sertraline increased. - Per patient, after Lamictal was added in the past, he felt more "even-keeled."  - Advised patient to stay in his lane at work and ride things out.  Encouraged him to go for a walk, even in the middle of work; just step away from the triggering stressors.  - Educated patient extensively about finding a method to help him walk away, such as an app. - Patient knows to begin engaging in mindfulness, breathing, and meditations for anger management, etc.  - Reviewed the "spokes of the wheel" of mood and health management.  Stressed the importance of ongoing prudent habits, including regular exercise, appropriate sleep hygiene, healthful dietary habits, and prayer/meditation to relax.  - Follow up in 2-3 months for reassessment.  2. Essential Hypertension - Stable at this time, but mildly elevated on intake. - Discussed goal as 135/85 or less.  - Continue treatment plan as prescribed.  See med list below. - Patient tolerating meds well without complication.  Denies S-E  - Prudent lifestyle changes such as dash diet and engaging in a regular exercise program discussed with patient.  - Ongoing ambulatory BP monitoring encouraged. Keep log and bring in next OV.  - Will continue to  monitor.  3. Hyperlipidemia - Need for lab work in near future. - Continue on Lipitor as prescribed. - No changes made to treatment plan at this time.  - Prudent dietary changes such as low saturated & trans fat diets encouraged. - Will continue to monitor.  4. Generalized Osteoarthritis - Managed on Meloxicam - Stable on current management with meloxicam. - Advised patient to begin acid blocker to help protect his stomach while taking Mobic daily. - Will continue to monitor.  5. Vitamin D Deficiency - Continue supplementation as prescribed.  No changes made today. - Will continue to monitor.  6. Tobacco Abuse & Smoking Cessation Counseling Told pt to think seriously about quitting smoking!  Told pt it is very important for his/her health and well being.    Smoking cessation instruction/counseling given: counseled patient on the dangers of tobacco use, advised patient to stop smoking, and reviewed strategies to maximize success  Discussed with patient that there are multiple treatments to aid in quitting smoking, however I explained none will work unless pt really want to quit  7. Lifestyle & Preventative Health Maintenance - Advised patient to continue working toward exercising to improve overall mental, physical, and emotional health.    - Encouraged patient to engage in daily physical activity, especially a formal exercise routine.  Recommended that the patient eventually strive for at least 150 minutes of moderate cardiovascular activity per week according to guidelines established by the [facility].   - Healthy dietary habits encouraged, including low-carb, and high amounts  of lean protein in diet.   - Patient should also consume adequate amounts of water.   Education and routine counseling performed. Handouts provided.   Meds ordered this encounter  Medications  . sertraline (ZOLOFT) 100 MG tablet    Sig: Take 1 tablet (100 mg total) by mouth daily.    Dispense:  90 tablet      Refill:  1  . ranitidine (ZANTAC) 75 MG tablet    Sig: Take 1 tablet (75 mg total) by mouth 2 (two) times daily.  Marland Kitchen lamoTRIgine (LAMICTAL) 100 MG tablet    Sig: Take 0.5 tablets (50 mg total) by mouth 2 (two) times daily.    Dispense:  90 tablet    Refill:  1    Needs appointment     Medications Discontinued During This Encounter  Medication Reason  . sertraline (ZOLOFT) 50 MG tablet Reorder  . lamoTRIgine (LAMICTAL) 100 MG tablet Reorder     The patient was counseled, risk factors were discussed, anticipatory guidance given.  Gross side effects, risk and benefits, and alternatives of medications discussed with patient.  Patient is aware that all medications have potential side effects and we are unable to predict every side effect or drug-drug interaction that may occur.  Expresses verbal understanding and consents to current therapy plan and treatment regimen.  Return for 2) 2-1/2 months for follow-up after inc zoloft OV come fasting for blood work..  Please see AVS handed out to patient at the end of our visit for further patient instructions/ counseling done pertaining to today's office visit.    Note:  This document was prepared using Dragon voice recognition software and may include unintentional dictation errors.  This document serves as a record of services personally performed by Mellody Dance, DO. It was created on her behalf by Toni Amend, a trained medical scribe. The creation of this record is based on the scribe's personal observations and the provider's statements to them.   I have reviewed the above medical documentation for accuracy and completeness and I concur.  Mellody Dance, DO 06/20/2018 12:52 PM        Subjective:    HPI: Aaron Cooper is a 53 y.o. male who presents to [facility] today for follow up of [place].    Notes that he and Renford Dills recently had the flu.   Mood Management - Acute  Anger Patient notes that he's feeling anxious and angry, says "it hits me out of the blue." "I'm fine one minute, and then I snap."  Experiencing a lot of anger and frustration, and has been at [employer] several times these past few weeks.  Wife states that she thinks a lot of it has to do with the passing of his mom and the selling of the house.  Patient notes that his brother is on antivirals for AIDS, and his sister has breast cancer.  Patient is worried more about his brother, as his brother is the executor of the will.   Joint Pain Takes Meloxicam/Mobic for joints.  Notes he still has issues, "but I guess that's because I'm getting old."   Vitamin D Supplementation Continues on Vitamin D.   Sleeping Habits Notes he has no problem going to sleep around 10 PM, but wakes up at 3:30 AM every morning.   HTN:  -  His blood pressure may or may not be controlled at home; he does not report home checks today.  - Patient  reports good compliance with blood pressure medications.  - Denies medication S-E   - Smoking Status noted; continues smoking a pack per day.  - He denies new onset of: chest pain, exercise intolerance, shortness of breath, dizziness, visual changes, headache, lower extremity swelling or claudication.    Last 3 blood pressure readings in our office are as follows: BP Readings from Last 3 Encounters:  06/20/18 137/86  03/07/18 132/74  10/02/17 126/82    Pulse Readings from Last 3 Encounters:  06/20/18 67  03/07/18 (!) 57  10/02/17 74    Filed Weights   06/20/18 1049  Weight: 221 lb 4.8 oz (100.4 kg)    1. 53 y.o. male here for cholesterol follow-up.   - Patient reports good compliance with medications or treatment plan  - Denies medication S-E   - Smoking Status noted   - He denies new onset of: chest pain, exercise intolerance, shortness of breath, dizziness, visual changes, headache, lower extremity swelling or claudication.   Denies new  or unusual myalgias.  The cholesterol last visit was:  Lab Results  Component Value Date   CHOL 223 (H) 10/02/2017   HDL 39 (L) 10/02/2017   LDLCALC 156 (H) 10/02/2017   TRIG 139 10/02/2017   CHOLHDL 5.7 (H) 10/02/2017    Hepatic Function Latest Ref Rng & Units 10/02/2017 06/03/2017 12/21/2016  Total Protein 6.0 - 8.5 g/dL 6.7 7.9 6.9  Albumin 3.5 - 5.5 g/dL 4.4 4.9 4.5  AST 0 - 40 IU/L _0 ALT 0 - 44 IU/L _1 Alk Phosphatase 39 - 117 IU/L 119(H) 118(H) 101  Total Bilirubin 0.0 - 1.2 mg/dL 0.2 0.3 0.2     Patient Care Team    Relationship Specialty Notifications Start End  Mellody Dance, DO PCP - General Family Medicine  11/09/15      Lab Results  Component Value Date   CREATININE 0.85 10/02/2017   BUN 22 10/02/2017   NA 138 10/02/2017   K 4.1 10/02/2017   CL 102 10/02/2017   CO2 21 10/02/2017    Lab Results  Component Value Date   CHOL 223 (H) 10/02/2017   CHOL 243 (H) 06/03/2017   CHOL 234 (H) 12/21/2016    Lab Results  Component Value Date   HDL 39 (L) 10/02/2017   HDL 43 06/03/2017   HDL 39 (L) 12/21/2016    Lab Results  Component Value Date   LDLCALC 156 (H) 10/02/2017   LDLCALC 169 (H) 06/03/2017   LDLCALC 165 (H) 12/21/2016    Lab Results  Component Value Date   TRIG 139 10/02/2017   TRIG 156 (H) 06/03/2017   TRIG 150 (H) 12/21/2016    Lab Results  Component Value Date   CHOLHDL 5.7 (H) 10/02/2017   CHOLHDL 5.7 (H) 06/03/2017   CHOLHDL 6.0 (H) 12/21/2016    No results found for: LDLDIRECT ===================================================================   Patient Active Problem List   Diagnosis Date Noted  . HTN (hypertension) 11/09/2015    Priority: High  . HLD (hyperlipidemia) 11/09/2015    Priority: High  . Mood disorder ([place]) 11/09/2015    Priority: Medium  . Vitamin D deficiency 11/09/2015    Priority: Medium  . Tobacco abuse 11/20/2016    Priority: Low  . Tobacco abuse counseling 11/20/2016    Priority:  Low  . Glucose intolerance (impaired glucose tolerance) 03/14/2018  . Low level of high density lipoprotein (HDL) 03/14/2018  . Occupational exposure to chemicals-patient served at  [employer] from 1984 through 87 06/03/2017  . Contact with and (suspected) exposure to other hazardous, chiefly nonmedicinal, chemicals 06/03/2017  . Adjustment disorder with mixed anxiety and depressed mood 02/14/2016  . Fatigue 11/09/2015  . Generalized osteoarthritis of multiple sites 11/09/2015  . 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 Obesity (BMI 30-39.9) 11/09/2015     Past Medical History:  Diagnosis Date  . Arthritis   . Depression   . Fatigue 11/09/2015  . HLD (hyperlipidemia) 11/09/2015  . HTN (hypertension) 11/09/2015  . Mood disorder in conditions classified elsewhere 11/09/2015  . Obesity (BMI 30-39.9) 11/09/2015  . Overweight (BMI 25.0-29.9) 11/09/2015  . Vitamin D insufficiency 11/09/2015     Past Surgical History:  Procedure Laterality Date  . BACK SURGERY  2013  . HAND SURGERY Right   . KNEE SURGERY Right 1995     Family History  Problem Relation Age of Onset  . Diabetes Mother   . Hyperlipidemia Mother   . Heart attack Father   . Hyperlipidemia Father   . Hypertension Father   . Cancer Sister        breast  . Stroke Maternal Aunt   . Alcohol abuse Maternal Grandmother   . Heart attack Brother   . Alcohol abuse Brother      Social History   Substance and Sexual Activity  Drug Use No  ,  Social History   Substance and Sexual Activity  Alcohol Use No  ,  Social History   Tobacco Use  Smoking Status Current Every Day Smoker  . Packs/day: 1.50  . Years: 25.00  . Pack years: 37.50  Smokeless Tobacco Never Used  Tobacco Comment   Pt refused cessation material  ,    Current Outpatient Medications on File Prior to Visit  Medication Sig Dispense Refill  . atorvastatin (LIPITOR) 80 MG tablet Take 80 mg nightly before bedtime. 90 tablet 1  . losartan-hydrochlorothiazide  (HYZAAR) 100-12.5 MG tablet Take 1 tablet by mouth daily. 90 tablet 1  . meloxicam (MOBIC) 15 MG tablet Take 1 tablet (15 mg total) by mouth daily. 90 tablet 1  . niacin 500 MG CR capsule Take 1 capsule (500 mg total) by mouth 2 (two) times daily with a meal. 180 capsule 1  . Vitamin D, Ergocalciferol, (DRISDOL) 1.25 MG (50000 UT) CAPS capsule Take one tablet wkly 12 capsule 10   No current facility-administered medications on file prior to visit.      No Known Allergies   Review of Systems:   General:  Denies fever, chills Optho/Auditory:   Denies visual changes, blurred vision Respiratory:   Denies SOB, cough, wheeze, DIB  Cardiovascular:   Denies chest pain, palpitations, painful respirations Gastrointestinal:   Denies nausea, vomiting, diarrhea.  Endocrine:     Denies new hot or cold intolerance Musculoskeletal:  Denies joint swelling, gait issues, or new unexplained myalgias/ arthralgias Skin:  Denies rash, suspicious lesions  Neurological:    Denies dizziness, unexplained weakness, numbness  Psychiatric/Behavioral:   Denies mood changes  Objective:    Blood pressure 137/86, pulse 67, temperature (!) 97.5 F (36.4 C), height _0  (1.753 m), weight 221 lb 4.8 oz (100.4 kg), SpO2 98 %.  Body mass index is 32.68 kg/m.  General: Well Developed, well nourished, and in no acute distress.  HEENT: Normocephalic, atraumatic, pupils equal round reactive to light, neck supple, No carotid bruits, no JVD Skin: Warm and dry, cap RF less 2 sec Cardiac: Regular rate and rhythm, S1, S2 WNL's,  no murmurs rubs or gallops Respiratory: ECTA B/L, Not using accessory muscles, speaking in full sentences. NeuroM-Sk: Ambulates w/o assistance, moves ext * 4 w/o difficulty, sensation grossly intact.  Ext: scant edema b/l lower ext Psych: No HI/SI, judgement and insight good, Euthymic mood. Full Affect.

## 2018-09-25 ENCOUNTER — Encounter: Payer: Self-pay | Admitting: Family Medicine

## 2018-09-25 ENCOUNTER — Ambulatory Visit (INDEPENDENT_AMBULATORY_CARE_PROVIDER_SITE_OTHER): Payer: BLUE CROSS/BLUE SHIELD | Admitting: Family Medicine

## 2018-09-25 ENCOUNTER — Other Ambulatory Visit: Payer: Self-pay

## 2018-09-25 VITALS — Ht 69.0 in | Wt 212.0 lb

## 2018-09-25 DIAGNOSIS — E785 Hyperlipidemia, unspecified: Secondary | ICD-10-CM | POA: Diagnosis not present

## 2018-09-25 DIAGNOSIS — F39 Unspecified mood [affective] disorder: Secondary | ICD-10-CM

## 2018-09-25 DIAGNOSIS — F4323 Adjustment disorder with mixed anxiety and depressed mood: Secondary | ICD-10-CM

## 2018-09-25 DIAGNOSIS — E786 Lipoprotein deficiency: Secondary | ICD-10-CM

## 2018-09-25 DIAGNOSIS — Z1211 Encounter for screening for malignant neoplasm of colon: Secondary | ICD-10-CM

## 2018-09-25 DIAGNOSIS — R7302 Impaired glucose tolerance (oral): Secondary | ICD-10-CM

## 2018-09-25 DIAGNOSIS — I10 Essential (primary) hypertension: Secondary | ICD-10-CM | POA: Diagnosis not present

## 2018-09-25 NOTE — Progress Notes (Signed)
Telehealth office visit note for Aaron Cooper, D.O- at [facility]   I connected with current patient today and verified that I am speaking with the correct person using two identifiers.   . Location of the patient: Home . Location of the provider: Office Only the patient (+/- their family members at pt's discretion) and myself were participating in the encounter    - This visit type was conducted due to national recommendations for restrictions regarding the COVID-19 Pandemic (e.g. social distancing) in an effort to limit this patient's exposure and mitigate transmission in our community.  This format is felt to be most appropriate for this patient at this time.   - The patient did not have access to video technology or had technical difficulties with video requiring transitioning to audio format only. - No physical exam could be performed with this format, beyond that communicated to [place] by the patient/ family members as noted.   - Additionally my office staff/ schedulers discussed with the patient that there may be a monetary charge related to this service, depending on their medical insurance.   The patient expressed understanding, and agreed to proceed.       History of Present Illness:  Last OV 06/20/18  GERD:   started acid blocker last OV- never had to start it at all- pt w/o sx.   Says he is trying to avoid eatin gat night etc.  Mood:   Inc sertraline last OV -  Much more patience, interpersonal relationships better.  Calmer.  Thinks he is doing better w the change. No s-e, dec libido, but meds have not inc from his baseline.   Bp:  Not feeling bad but not checking it at all, taking meds.   Pre-DM:  Last OV a1c 5.7.   -Smoking: still same amnt, no desire to cut back- same as wife  Actually today patient agrees along with his wife to do Cologuard.  We will set them up for it.  H/o    Impression and Recommendations:    1. Mood disorder (HCC)   2.  Adjustment disorder with mixed anxiety and depressed mood   3. Essential hypertension   4. Hyperlipidemia, unspecified hyperlipidemia type   5. Glucose intolerance (impaired glucose tolerance)   6. Low level of high density lipoprotein (HDL)   7. Screening for colon cancer     - needs FBW in near future- declines to come in today/ near future due to covid.   -BP: Lifestyle changes such as dash diet and engaging in a regular exercise program discussed with patient.  Ambulatory BP monitoring encouraged. Keep log and bring in next OV Continue current medication(s).     Chol: Dietary changes such as low saturated & trans fat and low carb/ ketogenic diets discussed with patient.  Encouraged regular exercise and weight loss when appropriate. Continue current medication(s).     - Smoking:  Both he and wife smoke. Told pt to think seriously about quitting smoking!  Told pt it is very important for his/her health and well being.  Smoking cessation instruction/counseling given:  counseled patient on the dangers of tobacco use, advised patient to stop smoking, and reviewed strategies to maximize success Discussed with patient that there are multiple treatments to aid in quitting smoking, however I explained none will work unless pt really want to quit Told to call 1-800-QUIT-NOW 3051582396) for free smoking cessation counseling and support, or pt can go online to  www.heart.org - the American Heart Association website and search "quit smoking ".     - As part of my medical decision making, I reviewed the following data within the electronic MEDICAL RECORD NUMBER History obtained from pt /family, CMA notes reviewed and incorporated if applicable, Labs reviewed, Radiograph/ tests reviewed if applicable and OV notes from prior OV's with me, as well as other specialists she/he has seen since seeing me last, were all reviewed and used in my medical decision making process today.   - Additionally, discussion  had with patient regarding txmnt plan, and their biases/concerns about that plan were used in my medical decision making today.   - The patient agreed with the plan and demonstrated an understanding of the instructions.   No barriers to understanding were identified.   - Red flag symptoms and signs discussed in detail.  Patient expressed understanding regarding what to do in case of emergency\ urgent symptoms.  The patient was advised to call back or seek an in-person evaluation if the symptoms worsen or if the condition fails to improve as anticipated.   Return for ov in 21mo or less- NEEDS FBW at same time.    Orders Placed This Encounter  Procedures  . Cologuard    I provided 20+ minutes of non-face-to-face time during this encounter,with over 50% of the time in direct counseling on patients medical conditions/ medical concerns.  Additional time was spent with charting and coordination of care after the actual visit commenced.   Note:  This note was prepared with assistance of Dragon voice recognition software. Occasional wrong-word or sound-a-like substitutions may have occurred due to the inherent limitations of voice recognition software.  Aaron Loteborah Rayen Dafoe, DO 09/25/18     Patient Care Team    Relationship Specialty Notifications Start End  Aaron Lotpalski, Amedeo Detweiler, DO PCP - General Family Medicine  11/09/15      -Vitals obtained; medications/ allergies reconciled;  personal medical, social, Sx etc.histories were updated by CMA, reviewed by me and are reflected in chart   Patient Active Problem List   Diagnosis Date Noted  . HTN (hypertension) 11/09/2015    Priority: High  . HLD (hyperlipidemia) 11/09/2015    Priority: High  . Mood disorder (HCC) 11/09/2015    Priority: Medium  . Vitamin D deficiency 11/09/2015    Priority: Medium  . Tobacco abuse 11/20/2016    Priority: Low  . Tobacco abuse counseling 11/20/2016    Priority: Low  . Glucose intolerance (impaired glucose  tolerance) 03/14/2018  . Low level of high density lipoprotein (HDL) 03/14/2018  . Occupational exposure to chemicals-patient served at [employer] from [employer] through 87 06/03/2017  . Contact with and (suspected) exposure to other hazardous, chiefly nonmedicinal, chemicals 06/03/2017  . Adjustment disorder with mixed anxiety and depressed mood 02/14/2016  . Fatigue 11/09/2015  . Generalized osteoarthritis of multiple sites 11/09/2015  . 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 Obesity (BMI 30-39.9) 11/09/2015     No outpatient medications have been marked as taking for the 09/25/18 encounter (Office Visit) with Aaron Lotpalski, Slyvia Lartigue, DO.     Allergies:  No Known Allergies   ROS:  See above HPI for pertinent positives and negatives   Objective:   Height 5\' 9"  (1.753 m), weight 212 lb (96.2 kg).  (if some vitals are omitted, this means that patient was UNABLE to obtain them even though they were asked to get them prior to OV today.  They were asked to call us at their earliest convenience with  these once obtained. )  General: A & O * 3; sounds in no acute distress; in usual state of health.  Skin: Pt confirms warm and dry extremities and pink fingertips HEENT: Pt confirms lips non-cyanotic Chest: Patient confirms normal chest excursion and movement Respiratory: speaking in full sentences, no conversational dyspnea; patient confirms no use of accessory muscles Psych: insight appears good, mood- appears full

## 2018-09-26 ENCOUNTER — Ambulatory Visit: Payer: Self-pay | Admitting: Family Medicine

## 2018-12-17 ENCOUNTER — Other Ambulatory Visit: Payer: Self-pay | Admitting: Family Medicine

## 2018-12-17 DIAGNOSIS — F4323 Adjustment disorder with mixed anxiety and depressed mood: Secondary | ICD-10-CM

## 2018-12-17 DIAGNOSIS — F39 Unspecified mood [affective] disorder: Secondary | ICD-10-CM

## 2018-12-29 ENCOUNTER — Other Ambulatory Visit: Payer: Self-pay

## 2018-12-29 DIAGNOSIS — E781 Pure hyperglyceridemia: Secondary | ICD-10-CM

## 2018-12-29 MED ORDER — NIACIN ER 500 MG PO CPCR: 500.0000 mg | ORAL_CAPSULE | Freq: Two times a day (BID) | ORAL | 0 refills | Status: DC

## 2019-01-07 ENCOUNTER — Other Ambulatory Visit: Payer: Self-pay

## 2019-01-07 ENCOUNTER — Encounter: Payer: Self-pay | Admitting: Family Medicine

## 2019-01-07 ENCOUNTER — Ambulatory Visit (INDEPENDENT_AMBULATORY_CARE_PROVIDER_SITE_OTHER): Payer: BC Managed Care – PPO | Admitting: Family Medicine

## 2019-01-07 VITALS — BP 159/79 | HR 75 | Temp 98.5°F | Resp 16 | Ht 70.0 in | Wt 217.0 lb

## 2019-01-07 DIAGNOSIS — E785 Hyperlipidemia, unspecified: Secondary | ICD-10-CM

## 2019-01-07 DIAGNOSIS — E559 Vitamin D deficiency, unspecified: Secondary | ICD-10-CM

## 2019-01-07 DIAGNOSIS — I10 Essential (primary) hypertension: Secondary | ICD-10-CM | POA: Diagnosis not present

## 2019-01-07 DIAGNOSIS — R7302 Impaired glucose tolerance (oral): Secondary | ICD-10-CM

## 2019-01-07 DIAGNOSIS — R5383 Other fatigue: Secondary | ICD-10-CM | POA: Diagnosis not present

## 2019-01-07 DIAGNOSIS — F4323 Adjustment disorder with mixed anxiety and depressed mood: Secondary | ICD-10-CM

## 2019-01-07 DIAGNOSIS — F39 Unspecified mood [affective] disorder: Secondary | ICD-10-CM | POA: Diagnosis not present

## 2019-01-07 DIAGNOSIS — Z716 Tobacco abuse counseling: Secondary | ICD-10-CM

## 2019-01-07 DIAGNOSIS — Z720 Tobacco use: Secondary | ICD-10-CM

## 2019-01-07 MED ORDER — AMLODIPINE-VALSARTAN-HCTZ 5-160-12.5 MG PO TABS: ORAL_TABLET | ORAL | 0 refills | Status: DC

## 2019-01-07 NOTE — Patient Instructions (Addendum)
Please look into purchasing the OMRON Series 3 blood pressure cuff for the upper arm.   -   Your goal blood pressure should be 135/85 or less on a regular basis, her medications should be started.  Normal blood pressure is 120/80 or less.    Hypertension Hypertension, commonly called high blood pressure, is when the force of blood pumping through the arteries is too strong. The arteries are the blood vessels that carry blood from the heart throughout the body. Hypertension forces the heart to work harder to pump blood and may cause arteries to become narrow or stiff. Having untreated or uncontrolled hypertension can cause heart attacks, strokes, kidney disease, and other problems. A blood pressure reading consists of a higher number over a lower number. Ideally, your blood pressure should be below 120/80. The first ("top") number is called the systolic pressure. It is a measure of the pressure in your arteries as your heart beats. The second ("bottom") number is called the diastolic pressure. It is a measure of the pressure in your arteries as the heart relaxes. What are the causes? The cause of this condition is not known. What increases the risk? Some risk factors for high blood pressure are under your control. Others are not. Factors you can change  Smoking.  Having type 2 diabetes mellitus, high cholesterol, or both.  Not getting enough exercise or physical activity.  Being overweight.  Having too much fat, sugar, calories, or salt (sodium) in your diet.  Drinking too much alcohol. Factors that are difficult or impossible to change  Having chronic kidney disease.  Having a family history of high blood pressure.  Age. Risk increases with age.  Race. You may be at higher risk if you are African-American.  Gender. Men are at higher risk than women before age 92. After age 78, women are at higher risk than men.  Having obstructive sleep apnea.  Stress. What are the signs  or symptoms? Extremely high blood pressure (hypertensive crisis) may cause:  Headache.  Anxiety.  Shortness of breath.  Nosebleed.  Nausea and vomiting.  Severe chest pain.  Jerky movements you cannot control (seizures).  How is this diagnosed? This condition is diagnosed by measuring your blood pressure while you are seated, with your arm resting on a surface. The cuff of the blood pressure monitor will be placed directly against the skin of your upper arm at the level of your heart. It should be measured at least twice using the same arm. Certain conditions can cause a difference in blood pressure between your right and left arms. Certain factors can cause blood pressure readings to be lower or higher than normal (elevated) for a short period of time:  When your blood pressure is higher when you are in a health care provider's office than when you are at home, this is called white coat hypertension. Most people with this condition do not need medicines.  When your blood pressure is higher at home than when you are in a health care provider's office, this is called masked hypertension. Most people with this condition may need medicines to control blood pressure.  If you have a high blood pressure reading during one visit or you have normal blood pressure with other risk factors:  You may be asked to return on a different day to have your blood pressure checked again.  You may be asked to monitor your blood pressure at home for 1 week or longer.  If you are diagnosed  with hypertension, you may have other blood or imaging tests to help your health care provider understand your overall risk for other conditions. How is this treated? This condition is treated by making healthy lifestyle changes, such as eating healthy foods, exercising more, and reducing your alcohol intake. Your health care provider may prescribe medicine if lifestyle changes are not enough to get your blood pressure  under control, and if:  Your systolic blood pressure is above 130.  Your diastolic blood pressure is above 80.  Your personal target blood pressure may vary depending on your medical conditions, your age, and other factors. Follow these instructions at home: Eating and drinking  Eat a diet that is high in fiber and potassium, and low in sodium, added sugar, and fat. An example eating plan is called the DASH (Dietary Approaches to Stop Hypertension) diet. To eat this way: ? Eat plenty of fresh fruits and vegetables. Try to fill half of your plate at each meal with fruits and vegetables. ? Eat whole grains, such as whole wheat pasta, brown rice, or whole grain bread. Fill about one quarter of your plate with whole grains. ? Eat or drink low-fat dairy products, such as skim milk or low-fat yogurt. ? Avoid fatty cuts of meat, processed or cured meats, and poultry with skin. Fill about one quarter of your plate with lean proteins, such as fish, chicken without skin, beans, eggs, and tofu. ? Avoid premade and processed foods. These tend to be higher in sodium, added sugar, and fat.  Reduce your daily sodium intake. Most people with hypertension should eat less than 1,500 mg of sodium a day.  Limit alcohol intake to no more than 1 drink a day for nonpregnant women and 2 drinks a day for men. One drink equals 12 oz of beer, 5 oz of wine, or 1 oz of hard liquor. Lifestyle  Work with your health care provider to maintain a healthy body weight or to lose weight. Ask what an ideal weight is for you.  Get at least 30 minutes of exercise that causes your heart to beat faster (aerobic exercise) most days of the week. Activities may include walking, swimming, or biking.  Include exercise to strengthen your muscles (resistance exercise), such as pilates or lifting weights, as part of your weekly exercise routine. Try to do these types of exercises for 30 minutes at least 3 days a week.  Do not use any  products that contain nicotine or tobacco, such as cigarettes and e-cigarettes. If you need help quitting, ask your health care provider.  Monitor your blood pressure at home as told by your health care provider.  Keep all follow-up visits as told by your health care provider. This is important. Medicines  Take over-the-counter and prescription medicines only as told by your health care provider. Follow directions carefully. Blood pressure medicines must be taken as prescribed.  Do not skip doses of blood pressure medicine. Doing this puts you at risk for problems and can make the medicine less effective.  Ask your health care provider about side effects or reactions to medicines that you should watch for. Contact a health care provider if:  You think you are having a reaction to a medicine you are taking.  You have headaches that keep coming back (recurring).  You feel dizzy.  You have swelling in your ankles.  You have trouble with your vision. Get help right away if:  You develop a severe headache or confusion.  You  have unusual weakness or numbness.  You feel faint.  You have severe pain in your chest or abdomen.  You vomit repeatedly.  You have trouble breathing. Summary  Hypertension is when the force of blood pumping through your arteries is too strong. If this condition is not controlled, it may put you at risk for serious complications.  Your personal target blood pressure may vary depending on your medical conditions, your age, and other factors. For most people, a normal blood pressure is less than 120/80.  Hypertension is treated with lifestyle changes, medicines, or a combination of both. Lifestyle changes include weight loss, eating a healthy, low-sodium diet, exercising more, and limiting alcohol. This information is not intended to replace advice given to you by your health care provider. Make sure you discuss any questions you have with your health care  provider. Document Released: 04/23/2005 Document Revised: 03/21/2016 Document Reviewed: 03/21/2016 Elsevier Interactive Patient Education  2018 [employer]    How to Take Your Blood Pressure   Blood pressure is a measurement of how strongly your blood is pressing against the walls of your arteries. Arteries are blood vessels that carry blood from your heart throughout your body. Your health care provider takes your blood pressure at each office visit. You can also take your own blood pressure at home with a blood pressure machine. You may need to take your own blood pressure:  To confirm a diagnosis of high blood pressure (hypertension).  To monitor your blood pressure over time.  To make sure your blood pressure medicine is working.  Supplies needed: To take your blood pressure, you will need a blood pressure machine. You can buy a blood pressure machine, or blood pressure monitor, at most drugstores or online. There are several types of home blood pressure monitors. When choosing one, consider the following:  Choose a monitor that has an arm cuff.  Choose a monitor that wraps snugly around your upper arm. You should be able to fit only one finger between your arm and the cuff.  Do not choose a monitor that measures your blood pressure from your wrist or finger.  Your health care provider can suggest a reliable monitor that will meet your needs. How to prepare To get the most accurate reading, avoid the following for 30 minutes before you check your blood pressure:  Drinking caffeine.  Drinking alcohol.  Eating.  Smoking.  Exercising.  Five minutes before you check your blood pressure:  Empty your bladder.  Sit quietly without talking in a dining chair, rather than in a soft couch or armchair.  How to take your blood pressure To check your blood pressure, follow the instructions in the manual that came with your blood pressure monitor. If you have a digital blood  pressure monitor, the instructions may be as follows: 1. Sit up straight. 2. Place your feet on the floor. Do not cross your ankles or legs. 3. Rest your left arm at the level of your heart on a table or desk or on the arm of a chair. 4. Pull up your shirt sleeve. 5. Wrap the blood pressure cuff around the upper part of your left arm, 1 inch (2.5 cm) above your elbow. It is best to wrap the cuff around bare skin. 6. Fit the cuff snugly around your arm. You should be able to place only one finger between the cuff and your arm. 7. Position the cord inside the groove of your elbow. 8. Press the power button.  9. Sit quietly while the cuff inflates and deflates. 10. Read the digital reading on the monitor screen and write it down (record it). 11. Wait 2-3 minutes, then repeat the steps, starting at step 1.  What does my blood pressure reading mean? A blood pressure reading consists of a higher number over a lower number. Ideally, your blood pressure should be below 120/80. The first ("top") number is called the systolic pressure. It is a measure of the pressure in your arteries as your heart beats. The second ("bottom") number is called the diastolic pressure. It is a measure of the pressure in your arteries as the heart relaxes. Blood pressure is classified into four stages. The following are the stages for adults who do not have a short-term serious illness or a chronic condition. Systolic pressure and diastolic pressure are measured in a unit called mm Hg. Normal  Systolic pressure: below 120.  Diastolic pressure: below 80. Elevated  Systolic pressure: 120-129.  Diastolic pressure: below 80. Hypertension stage 1  Systolic pressure: 130-139.  Diastolic pressure: 80-89. Hypertension stage 2  Systolic pressure: 140 or above.  Diastolic pressure: 90 or above. You can have prehypertension or hypertension even if only the systolic or only the diastolic number in your reading is higher  than normal. Follow these instructions at home:  Check your blood pressure as often as recommended by your health care provider.  Take your monitor to the next appointment with your health care provider to make sure: ? That you are using it correctly. ? That it provides accurate readings.  Be sure you understand what your goal blood pressure numbers are.  Tell your health care provider if you are having any side effects from blood pressure medicine. Contact a health care provider if:  Your blood pressure is consistently high. Get help right away if:  Your systolic blood pressure is higher than 180.  Your diastolic blood pressure is higher than 110. This information is not intended to replace advice given to you by your health care provider. Make sure you discuss any questions you have with your health care provider. Document Released: 09/30/2015 Document Revised: 12/13/2015 Document Reviewed: 09/30/2015 Elsevier Interactive Patient Education  [employer].

## 2019-01-07 NOTE — Progress Notes (Signed)
Impression and Recommendations:    1. Essential hypertension   2. Hyperlipidemia, unspecified hyperlipidemia type   3. Vitamin D deficiency   4. Mood disorder ([place])   5. Tobacco abuse   6. Tobacco abuse counseling   7. Glucose intolerance (impaired glucose tolerance)   8. Adjustment disorder with mixed anxiety and depressed mood   9. Other fatigue      Novel COVID-19 Counseling - Novel Covid -19 counseling done; all questions were answered.   - Current CDC / federal and [place] guidelines reviewed with patient  - Reminded pt of extreme importance of social distancing; wearing a mask when out in public; insensate handwashing and cleaning of surfaces, avoiding unnecessary trips for shopping and avoiding ALL but emergency appts etc. - Told patient to be prepared, not scared; and be smart for the sake of others - Patient will call with any additional concerns   Hypertension - Not controlled at this time.  - Discussed potential reasons for increased BP, including increased sodium in diet, increased smoking, decreased physical activity.  Lifestyle changes such as dash diet and engaging in a regular exercise program discussed with patient.  Educational handouts provided.  - Losartan-HCTZ discontinued. - Begin combination olmesartan-amlodipine-HCTZ.   See med list.  - STRONGLY encouraged patient to engage in ambulatory BP monitoring. - Advised patient to check his BP 3-4 times per week. - Keep a log of blood pressure and pulse and bring in to next OV.  - Will continue to monitor.   Hyperlipidemia - Re-check today. - Continue treatment plan as prescribed. - Will continue to monitor and evaluate.   Vitamin D Deficiency - Re-check today. - Continue treatment plan as prescribed. - Will continue to monitor and evaluate.   Mood Disorder - Stable at this time. - Continue management as prescribed. - Will continue to monitor.   Tobacco Abuse & Counseling -  Encouraged patient in smoking cessation goals. - Patient with no desire to quit at this time. - Will continue to monitor and encourage patient to quit smoking.   BMI Counseling - Body mass index is 31.14 kg/m - Advised patient that he is down 4 lbs from prior.  Encouraged patient to continue losing weight.  - Explained to patient what BMI refers to, and what it means medically.    Told patient to think about it as a "medical risk stratification measurement" and how increasing BMI is associated with increasing risk/ or worsening state of various diseases such as hypertension, hyperlipidemia, diabetes, premature OA, depression etc.  American Heart Association guidelines for healthy diet, basically Mediterranean diet, and exercise guidelines of 30 minutes 5 days per week or more discussed in detail.  Health counseling performed.  All questions answered.   Lifestyle & Preventative Health Maintenance - Advised patient to continue working toward exercising to improve overall mental, physical, and emotional health.    - In addition to prescription management, reviewed the "spokes of the wheel" of mood and health management.  Stressed the importance of ongoing prudent habits, including regular exercise, appropriate sleep hygiene, healthful dietary habits, and prayer/meditation to relax.  - Encouraged patient to engage in daily physical activity, especially a formal exercise routine.  Recommended that the patient eventually strive for at least 150 minutes of moderate cardiovascular activity per week according to guidelines established by the [facility].   - Healthy dietary habits encouraged, including low-carb, and high amounts of lean protein in diet.   - Patient should also consume adequate  amounts of water.   Recommendations - Fasting lab work obtained today. - Further evaluation will be recommended once labs are resulted. - Discussed doing next appointment live video/tele-health.   Education  and routine counseling performed. Handouts provided.   Orders Placed This Encounter  Procedures  . CBC w/Diff  . Comp Met (CMET)  . HgB A1c  . Lipid Panel With LDL/HDL Ratio  . TSH  . T4, free  . T3  . Vitamin D (25 hydroxy)    Meds ordered this encounter  Medications  . amLODIPine-Valsartan-HCTZ 5-160-12.5 MG TABS    Sig: 1 po qd    Dispense:  90 tablet    Refill:  0    Medications Discontinued During This Encounter  Medication Reason  . losartan-hydrochlorothiazide (HYZAAR) 100-12.5 MG tablet      The patient was counseled, risk factors were discussed, anticipatory guidance given.  Gross side effects, risk and benefits, and alternatives of medications discussed with patient.  Patient is aware that all medications have potential side effects and we are unable to predict every side effect or drug-drug interaction that may occur.  Expresses verbal understanding and consents to current therapy plan and treatment regimen.  Return for Hypertension follow up every 3 mo or sooner if issues; bring BP log.  Please see AVS handed out to patient at the end of our visit for further patient instructions/ counseling done pertaining to today's office visit.    Note:  This document was prepared using Dragon voice recognition software and may include unintentional dictation errors.  This document serves as a record of services personally performed by Mellody Dance, DO. It was created on her behalf by Toni Amend, a trained medical scribe. The creation of this record is based on the scribe's personal observations and the provider's statements to them.   I have reviewed the above medical documentation for accuracy and completeness and I concur.  Mellody Dance, DO 01/07/2019 10:30 AM    Subjective:    HPI: Aaron Cooper is a 53 y.o. male who presents to [place] at [facility] today for follow up of [name].    States he's taking "every  [medication] except for the ranitidine."  Denies heartburn.  Feels his fatigue has improved a bit.  Is drinking 8 bottles of water per day.  Mood Management Shrugs and states that, emotionally, "I try to control what I can."  Notes his main stress comes from work, but this has been manageable. He's still hoping to retire at some point.  Notes that his workplace tells him they value him.  Some of his concerns are also derived from family worries, especially worrying about his wife's health.  HTN:  Patient is unsure why his blood pressure is high today, but implies that he's stressed about his wife.  States his wife has "pretty much been on house arrest" because she's "scared to go out with COVID."  Denies smoking more than usual, denies eating more salt than usual.  Says "everything's stayed about the same" regarding his life habits.  -  His blood pressure may or may not be controlled at home.  Pt has not been checking it regularly.  - Patient reports good compliance with blood pressure medications.  - Denies medication S-E   - Smoking Status noted; still smoking about the same.  - He denies new onset of: chest pain, exercise intolerance, shortness of breath, dizziness, visual changes, headache, heart palpitations, or lower extremity swelling or  claudication.    Last 3 blood pressure readings in our office are as follows: BP Readings from Last 3 Encounters:  01/07/19 (!) 159/79  06/20/18 137/86  03/07/18 132/74    Pulse Readings from Last 3 Encounters:  01/07/19 75  06/20/18 67  03/07/18 (!) 57    Filed Weights   01/07/19 0913  Weight: 217 lb (98.4 kg)    1. 53 y.o. male here for cholesterol follow-up.   - Patient reports good compliance with medications or treatment plan  - Denies medication S-E   - Smoking Status noted   - He denies new onset of: chest pain, exercise intolerance, shortness of breath, dizziness, visual changes, headache, lower extremity swelling  or claudication.   Denies myalgias  The cholesterol last visit was:  Lab Results  Component Value Date   CHOL 223 (H) 10/02/2017   HDL 39 (L) 10/02/2017   LDLCALC 156 (H) 10/02/2017   TRIG 139 10/02/2017   CHOLHDL 5.7 (H) 10/02/2017    Hepatic Function Latest Ref Rng & Units 10/02/2017 06/03/2017 12/21/2016  Total Protein 6.0 - 8.5 g/dL 6.7 7.9 6.9  Albumin 3.5 - 5.5 g/dL 4.4 4.9 4.5  AST 0 - 40 IU/L 18 18 15   ALT 0 - 44 IU/L 19 22 19   Alk Phosphatase 39 - 117 IU/L 119(H) 118(H) 101  Total Bilirubin 0.0 - 1.2 mg/dL 0.2 0.3 0.2     Depression screen [facility] 2/9 01/07/2019 09/25/2018 06/20/2018  Decreased Interest 0 0 1  Down, Depressed, Hopeless 0 0 1  PHQ - 2 Score 0 0 2  Altered sleeping 0 0 0  Tired, decreased energy 0 0 1  Change in appetite 0 0 1  Feeling bad or failure about yourself  0 0 0  Trouble concentrating 0 0 0  Moving slowly or fidgety/restless 0 0 1  Suicidal thoughts 0 0 0  PHQ-9 Score 0 0 5  Difficult doing work/chores Not difficult at all Not difficult at all Very difficult  Some recent data might be hidden   GAD 7 : Generalized Anxiety Score 09/25/2018 06/20/2018 03/20/2016  Nervous, Anxious, on Edge 0 2 0  Control/stop worrying 0 - 0  Worry too much - different things 0 2 3  Trouble relaxing 0 3 0  Restless 0 1 0  Easily annoyed or irritable 1 3 0  Afraid - awful might happen 0 2 0  Total GAD 7 Score 1 - 3  Anxiety Difficulty Not difficult at all Very difficult Not difficult at all       Patient Care Team    Relationship Specialty Notifications Start End  Mellody Dance, DO PCP - General Family Medicine  11/09/15      Lab Results  Component Value Date   CREATININE 0.85 10/02/2017   BUN 22 10/02/2017   NA 138 10/02/2017   K 4.1 10/02/2017   CL 102 10/02/2017   CO2 21 10/02/2017    Lab Results  Component Value Date   CHOL 223 (H) 10/02/2017   CHOL 243 (H) 06/03/2017   CHOL 234 (H) 12/21/2016    Lab Results  Component Value Date   HDL  39 (L) 10/02/2017   HDL 43 06/03/2017   HDL 39 (L) 12/21/2016    Lab Results  Component Value Date   LDLCALC 156 (H) 10/02/2017   LDLCALC 169 (H) 06/03/2017   LDLCALC 165 (H) 12/21/2016    Lab Results  Component Value Date   TRIG 139 10/02/2017  TRIG 156 (H) 06/03/2017   TRIG 150 (H) 12/21/2016    Lab Results  Component Value Date   CHOLHDL 5.7 (H) 10/02/2017   CHOLHDL 5.7 (H) 06/03/2017   CHOLHDL 6.0 (H) 12/21/2016    No results found for: LDLDIRECT ===================================================================   Patient Active Problem List   Diagnosis Date Noted  . HTN (hypertension) 11/09/2015    Priority: High  . HLD (hyperlipidemia) 11/09/2015    Priority: High  . Mood disorder ([place]) 11/09/2015    Priority: Medium  . Vitamin D deficiency 11/09/2015    Priority: Medium  . Tobacco abuse 11/20/2016    Priority: Low  . Tobacco abuse counseling 11/20/2016    Priority: Low  . Glucose intolerance (impaired glucose tolerance) 03/14/2018  . Low level of high density lipoprotein (HDL) 03/14/2018  . Occupational exposure to chemicals-patient served at [employer] from 1984 through 87 06/03/2017  . Contact with and (suspected) exposure to other hazardous, chiefly nonmedicinal, chemicals 06/03/2017  . Adjustment disorder with mixed anxiety and depressed mood 02/14/2016  . Fatigue 11/09/2015  . Generalized osteoarthritis of multiple sites 11/09/2015  . 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 Obesity (BMI 30-39.9) 11/09/2015     Past Medical History:  Diagnosis Date  . Arthritis   . Depression   . Fatigue 11/09/2015  . HLD (hyperlipidemia) 11/09/2015  . HTN (hypertension) 11/09/2015  . Mood disorder in conditions classified elsewhere 11/09/2015  . Obesity (BMI 30-39.9) 11/09/2015  . Overweight (BMI 25.0-29.9) 11/09/2015  . Vitamin D insufficiency 11/09/2015     Past Surgical History:  Procedure Laterality Date  . BACK SURGERY  2013  . HAND SURGERY Right   . KNEE  SURGERY Right 1995     Family History  Problem Relation Age of Onset  . Diabetes Mother   . Hyperlipidemia Mother   . Heart attack Father   . Hyperlipidemia Father   . Hypertension Father   . Cancer Sister        breast  . Stroke Maternal Aunt   . Alcohol abuse Maternal Grandmother   . Heart attack Brother   . Alcohol abuse Brother      Social History   Substance and Sexual Activity  Drug Use No  ,  Social History   Substance and Sexual Activity  Alcohol Use No  ,  Social History   Tobacco Use  Smoking Status Current Every Day Smoker  . Packs/day: 1.50  . Years: 25.00  . Pack years: 37.50  Smokeless Tobacco Never Used  Tobacco Comment   Pt refused cessation material  ,    Current Outpatient Medications on File Prior to Visit  Medication Sig Dispense Refill  . atorvastatin (LIPITOR) 80 MG tablet Take 80 mg nightly before bedtime. 90 tablet 1  . lamoTRIgine (LAMICTAL) 100 MG tablet TAKE 1/2 TABLET(50 MG) BY MOUTH TWICE DAILY 90 tablet 1  . meloxicam (MOBIC) 15 MG tablet Take 1 tablet (15 mg total) by mouth daily. 90 tablet 1  . niacin 500 MG CR capsule Take 1 capsule (500 mg total) by mouth 2 (two) times daily with a meal. 180 capsule 0  . ranitidine (ZANTAC) 75 MG tablet Take 1 tablet (75 mg total) by mouth 2 (two) times daily.    . sertraline (ZOLOFT) 100 MG tablet TAKE 1 TABLET(100 MG) BY MOUTH DAILY 90 tablet 1  . Vitamin D, Ergocalciferol, (DRISDOL) 1.25 MG (50000 UT) CAPS capsule Take one tablet wkly 12 capsule 10   No current facility-administered medications on file  prior to visit.      No Known Allergies   Review of Systems:   General:  Denies fever, chills Optho/Auditory:   Denies visual changes, blurred vision Respiratory:   Denies SOB, cough, wheeze, DIB  Cardiovascular:   Denies chest pain, palpitations, painful respirations Gastrointestinal:   Denies nausea, vomiting, diarrhea.  Endocrine:     Denies new hot or cold intolerance  Musculoskeletal:  Denies joint swelling, gait issues, or new unexplained myalgias/ arthralgias Skin:  Denies rash, suspicious lesions  Neurological:    Denies dizziness, unexplained weakness, numbness  Psychiatric/Behavioral:   Denies mood changes  Objective:    Blood pressure (!) 159/79, pulse 75, temperature 98.5 F (36.9 C), resp. rate 16, height 5' 10"  (1.778 m), weight 217 lb (98.4 kg), SpO2 96 %.  Body mass index is 31.14 kg/m.  General: Well Developed, well nourished, and in no acute distress.  HEENT: Normocephalic, atraumatic, pupils equal round reactive to light, neck supple, No carotid bruits, no JVD Skin: Warm and dry, cap RF less 2 sec Cardiac: Regular rate and rhythm, S1, S2 WNL's, no murmurs rubs or gallops Respiratory: ECTA B/L, Not using accessory muscles, speaking in full sentences. NeuroM-Sk: Ambulates w/o assistance, moves ext * 4 w/o difficulty, sensation grossly intact.  Ext: scant edema b/l lower ext Psych: No HI/SI, judgement and insight good, Euthymic mood. Full Affect.

## 2019-01-08 LAB — COMPREHENSIVE METABOLIC PANEL
ALT: 21 IU/L (ref 0–44)
AST: 17 IU/L (ref 0–40)
Albumin/Globulin Ratio: 1.9 (ref 1.2–2.2)
Albumin: 4.6 g/dL (ref 3.8–4.9)
Alkaline Phosphatase: 128 IU/L — ABNORMAL HIGH (ref 39–117)
BUN/Creatinine Ratio: 21 — ABNORMAL HIGH (ref 9–20)
BUN: 16 mg/dL (ref 6–24)
Bilirubin Total: 0.2 mg/dL (ref 0.0–1.2)
CO2: 20 mmol/L (ref 20–29)
Calcium: 9.1 mg/dL (ref 8.7–10.2)
Chloride: 100 mmol/L (ref 96–106)
Creatinine, Ser: 0.78 mg/dL (ref 0.76–1.27)
GFR calc Af Amer: 118 mL/min/{1.73_m2} (ref >59)
GFR calc non Af Amer: 102 mL/min/{1.73_m2} (ref >59)
Globulin, Total: 2.4 g/dL (ref 1.5–4.5)
Glucose: 90 mg/dL (ref 65–99)
Potassium: 4.5 mmol/L (ref 3.5–5.2)
Sodium: 135 mmol/L (ref 134–144)
Total Protein: 7 g/dL (ref 6.0–8.5)

## 2019-01-08 LAB — CBC WITH DIFFERENTIAL/PLATELET
Basophils Absolute: 0.1 10*3/uL (ref 0.0–0.2)
Basos: 1 %
EOS (ABSOLUTE): 0.1 10*3/uL (ref 0.0–0.4)
Eos: 1 %
Hematocrit: 45.6 % (ref 37.5–51.0)
Hemoglobin: 15.4 g/dL (ref 13.0–17.7)
Immature Grans (Abs): 0 10*3/uL (ref 0.0–0.1)
Immature Granulocytes: 0 %
Lymphocytes Absolute: 1.5 10*3/uL (ref 0.7–3.1)
Lymphs: 16 %
MCH: 30.4 pg (ref 26.6–33.0)
MCHC: 33.8 g/dL (ref 31.5–35.7)
MCV: 90 fL (ref 79–97)
Monocytes Absolute: 0.6 10*3/uL (ref 0.1–0.9)
Monocytes: 6 %
Neutrophils Absolute: 7.2 10*3/uL — ABNORMAL HIGH (ref 1.4–7.0)
Neutrophils: 76 %
Platelets: 229 10*3/uL (ref 150–450)
RBC: 5.06 x10E6/uL (ref 4.14–5.80)
RDW: 13.4 % (ref 11.6–15.4)
WBC: 9.5 10*3/uL (ref 3.4–10.8)

## 2019-01-08 LAB — LIPID PANEL WITH LDL/HDL RATIO
Cholesterol, Total: 230 mg/dL — ABNORMAL HIGH (ref 100–199)
HDL: 35 mg/dL — ABNORMAL LOW (ref >39)
LDL Chol Calc (NIH): 154 mg/dL — ABNORMAL HIGH (ref 0–99)
LDL/HDL Ratio: 4.4 ratio — ABNORMAL HIGH (ref 0.0–3.6)
Triglycerides: 220 mg/dL — ABNORMAL HIGH (ref 0–149)
VLDL Cholesterol Cal: 41 mg/dL — ABNORMAL HIGH (ref 5–40)

## 2019-01-08 LAB — HEMOGLOBIN A1C
Est. average glucose Bld gHb Est-mCnc: 108 mg/dL
Hgb A1c MFr Bld: 5.4 % (ref 4.8–5.6)

## 2019-01-08 LAB — TSH: TSH: 1.18 u[IU]/mL (ref 0.450–4.500)

## 2019-01-08 LAB — T4, FREE: Free T4: 1.25 ng/dL (ref 0.82–1.77)

## 2019-01-08 LAB — VITAMIN D 25 HYDROXY (VIT D DEFICIENCY, FRACTURES): Vit D, 25-Hydroxy: 45.4 ng/mL (ref 30.0–100.0)

## 2019-01-08 LAB — T3: T3, Total: 107 ng/dL (ref 71–180)

## 2019-01-24 IMAGING — DX DG KNEE 1-2V*R*
2 series · 2 of 2 positions shown · non-contrast
Comparison: None.

CLINICAL DATA: Acute right knee pain.

EXAM:
RIGHT KNEE - 1-2 VIEW

[knee ap]
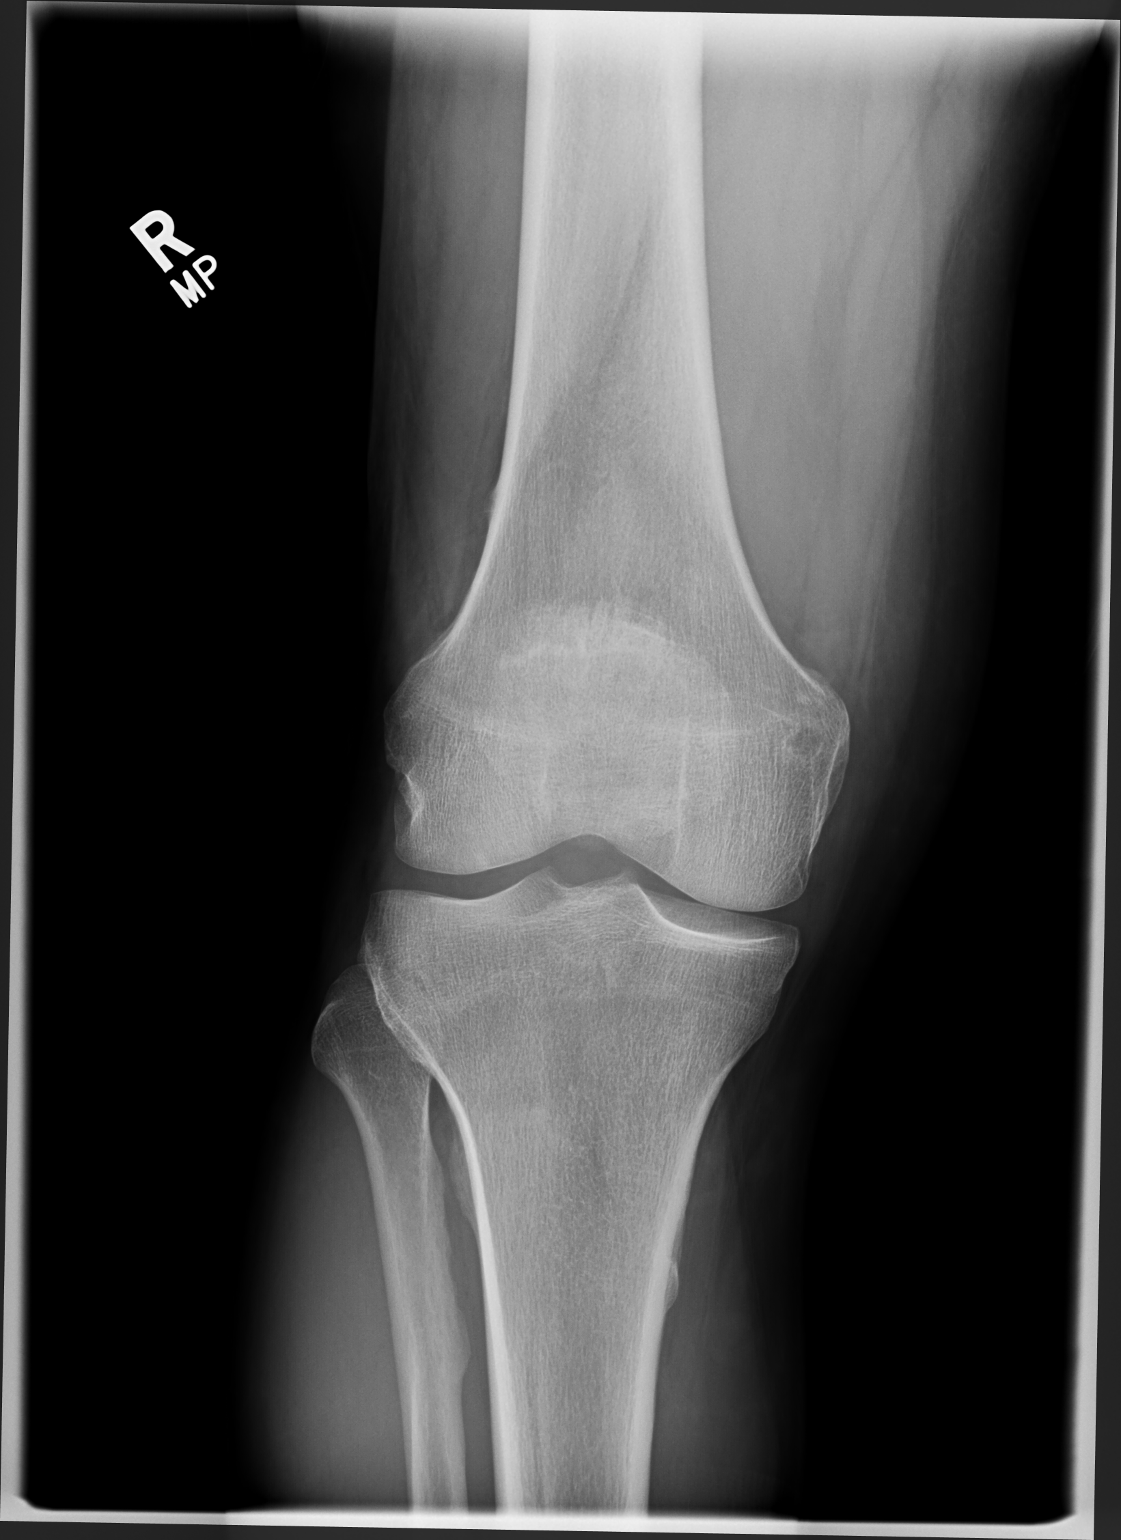

[knee lat]
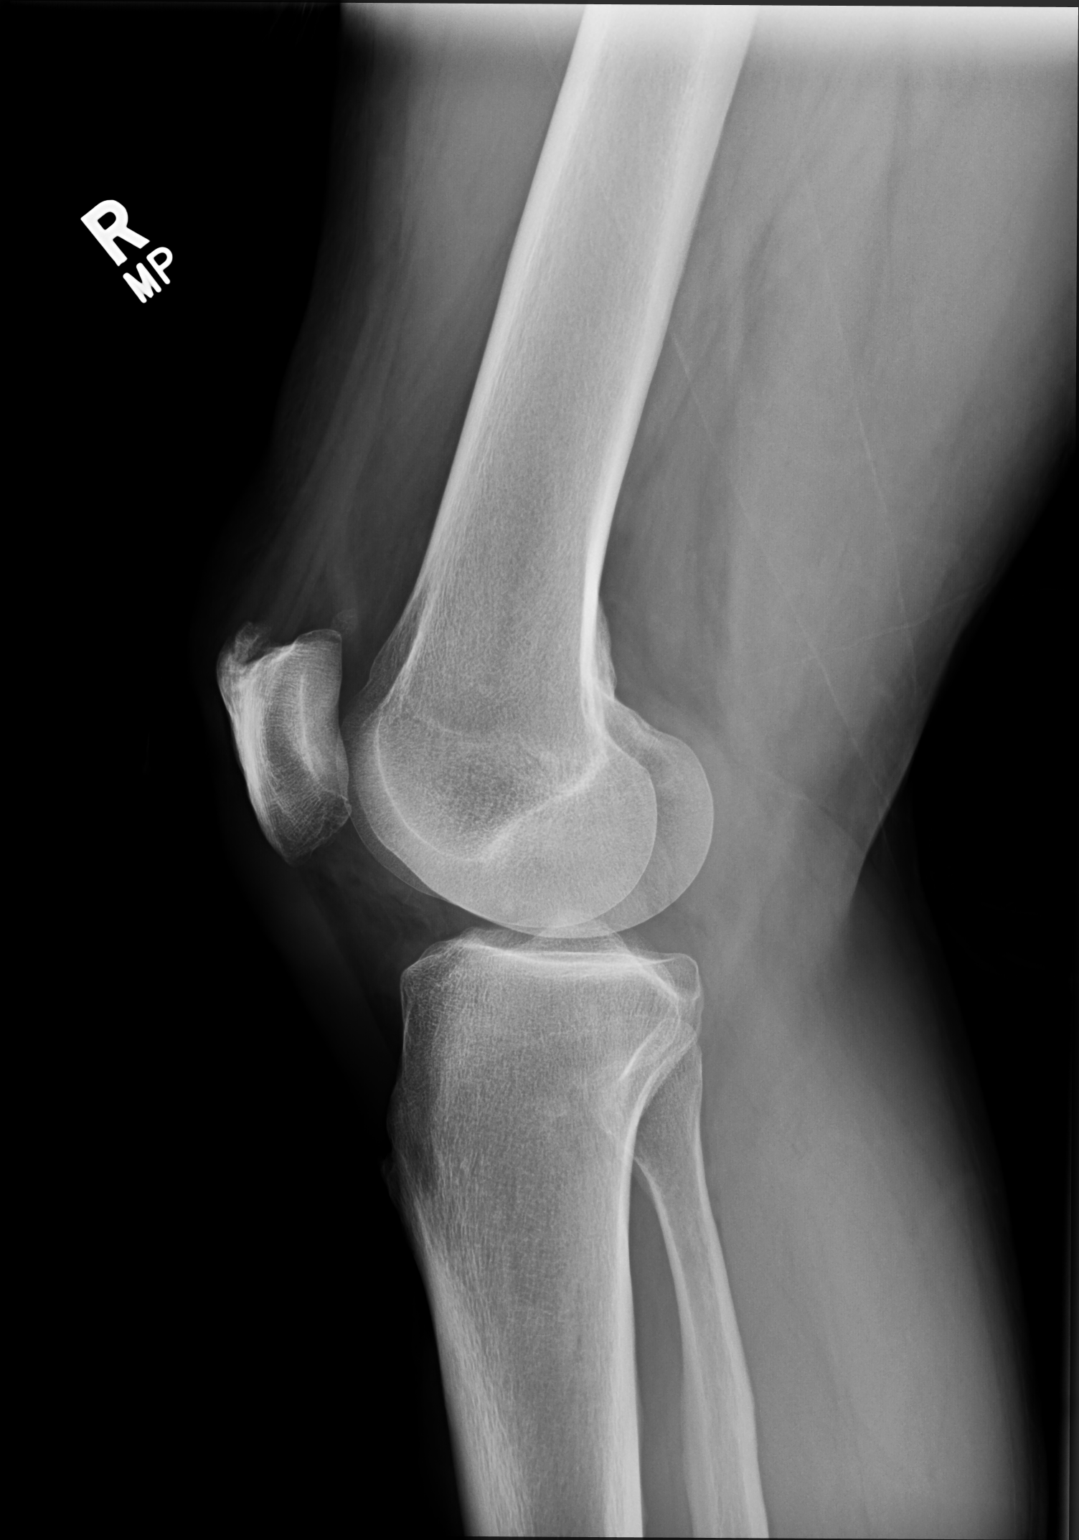

[2 of 2 positions shown; findings below may reference images not displayed]

FINDINGS: No evidence of fracture, dislocation, or joint effusion. Mild
spurring of superior patella is noted. Joint spaces are intact. Soft
tissues are unremarkable.
IMPRESSION: Mild patellar spurring. No other abnormality seen in the right knee.

## 2019-01-24 IMAGING — DX DG KNEE 1-2V*L*
2 series · 2 of 2 positions shown · non-contrast
Comparison: None.

CLINICAL DATA: Acute left knee pain.

EXAM:
LEFT KNEE - 1-2 VIEW

[knee ap]
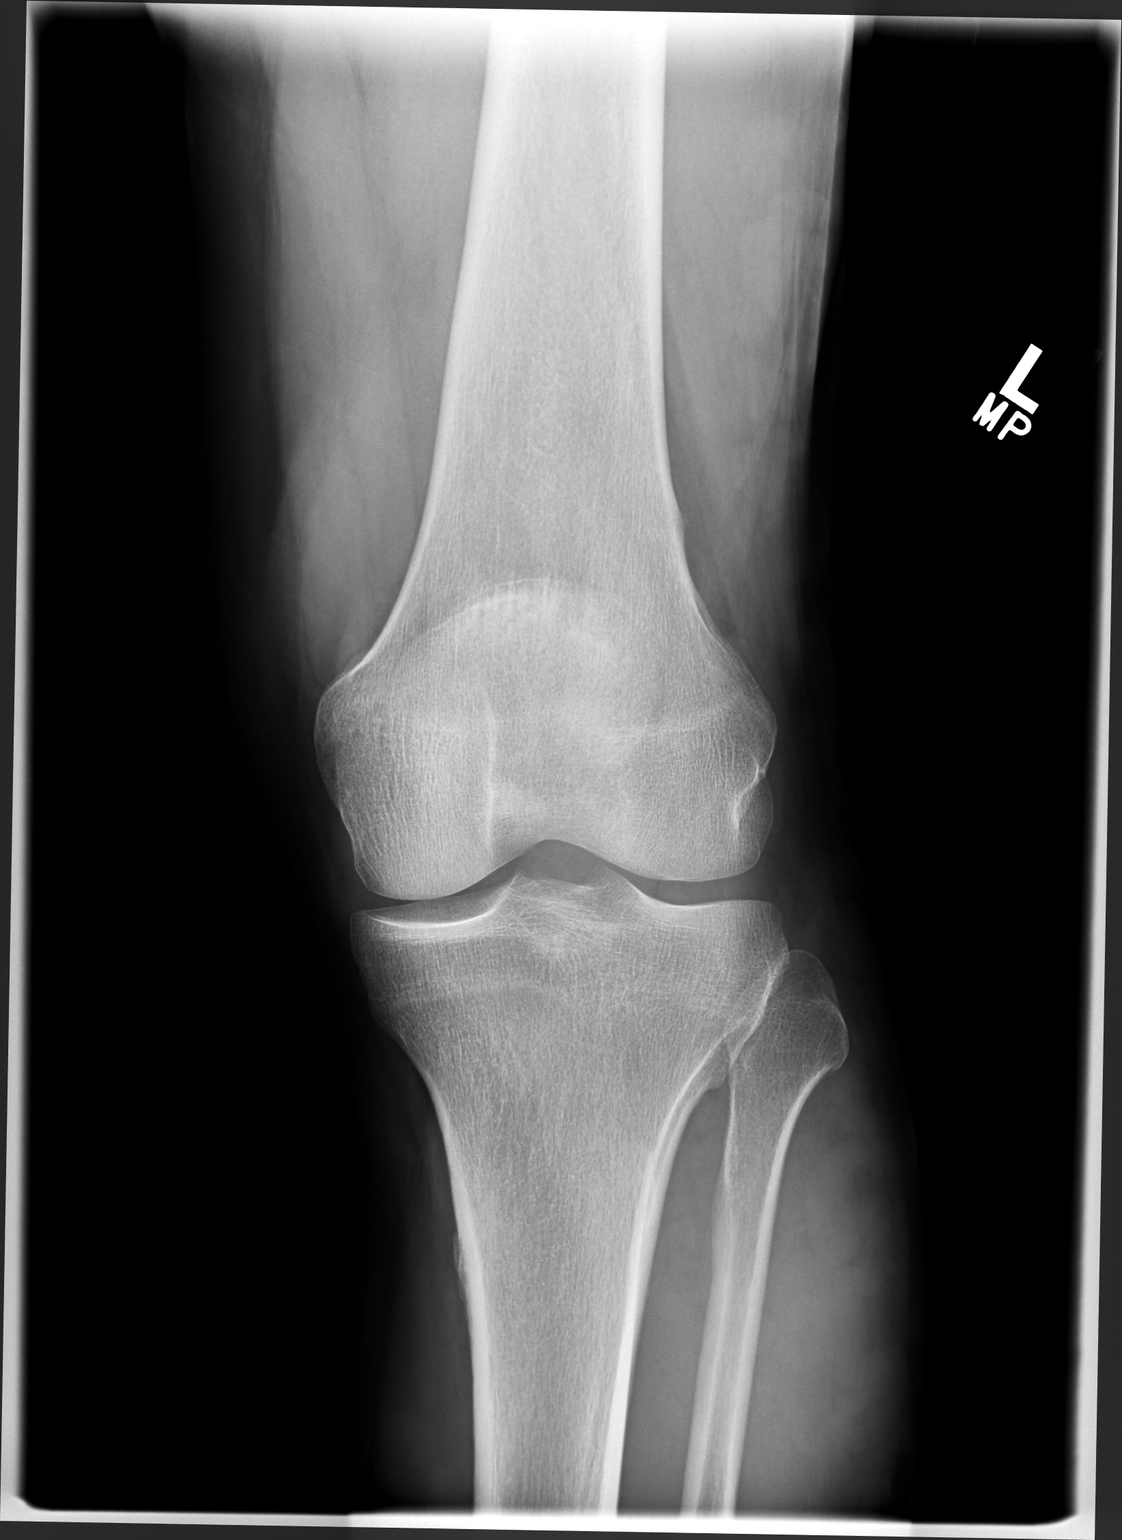

[knee lat]
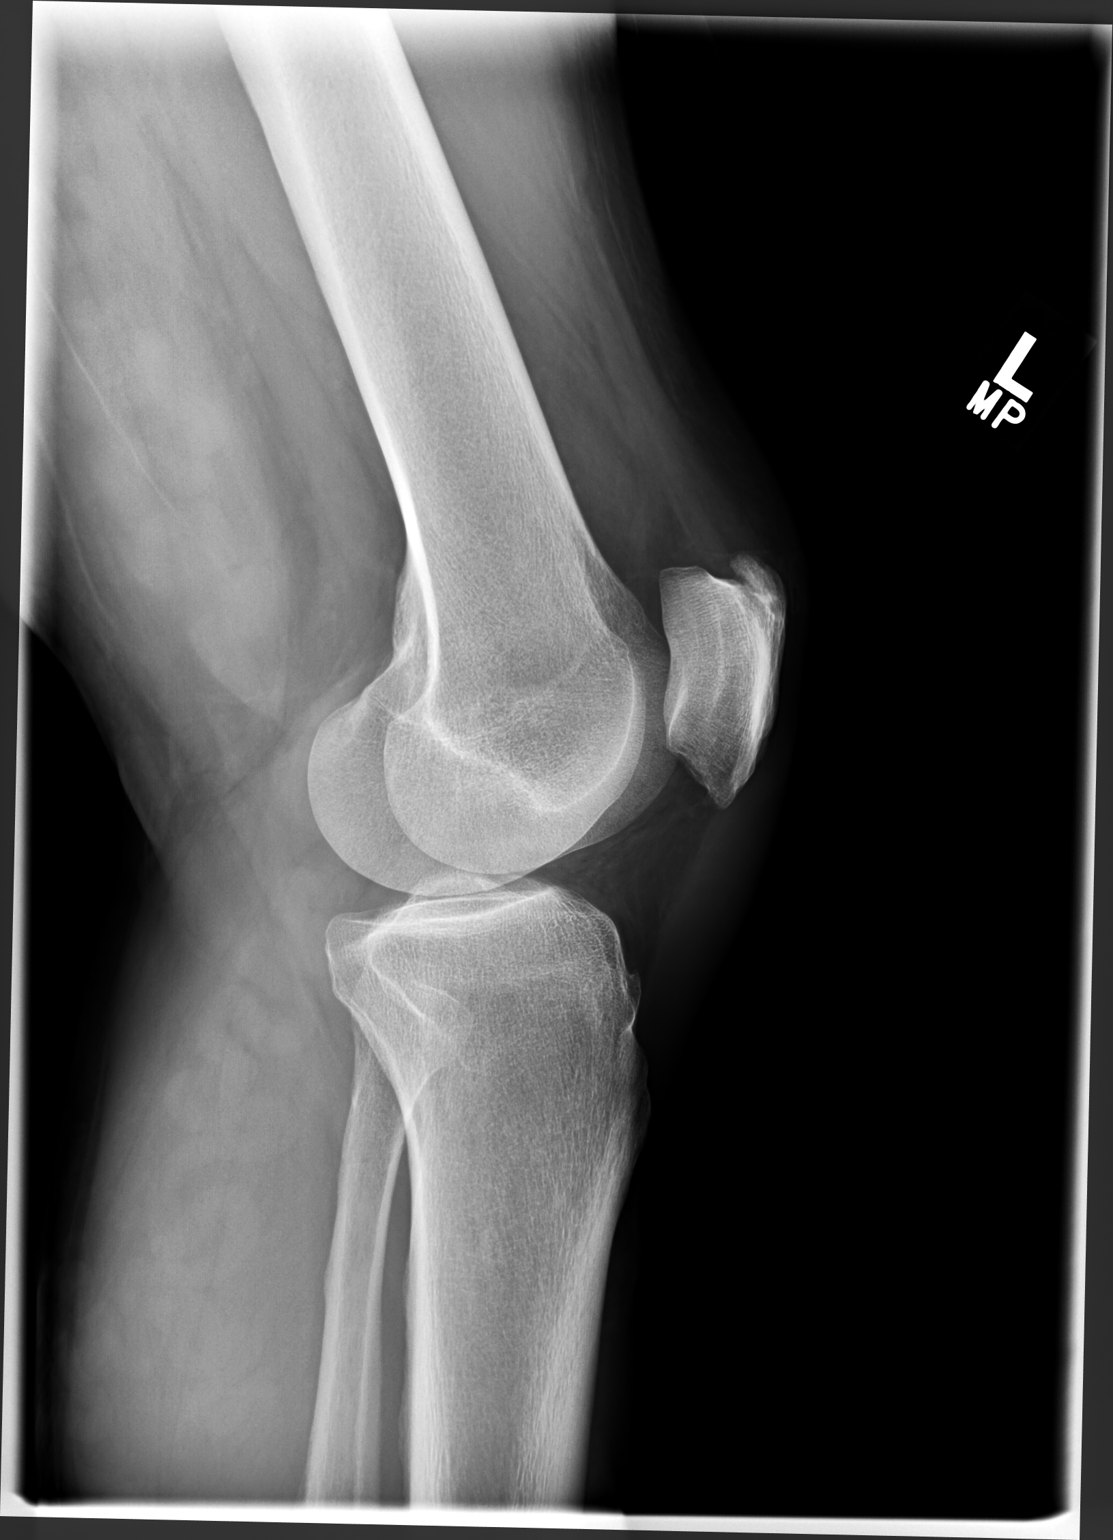

[2 of 2 positions shown; findings below may reference images not displayed]

FINDINGS: No evidence of fracture, dislocation, or joint effusion. Mild
spurring of the superior portion of patella is noted. No joint space
narrowing is noted. Soft tissues are unremarkable.
IMPRESSION: Mild patellar spurring.  No other abnormality seen in the left knee.

## 2019-03-24 ENCOUNTER — Other Ambulatory Visit: Payer: Self-pay | Admitting: Family Medicine

## 2019-03-24 DIAGNOSIS — M159 Polyosteoarthritis, unspecified: Secondary | ICD-10-CM

## 2019-03-25 ENCOUNTER — Other Ambulatory Visit: Payer: Self-pay | Admitting: Family Medicine

## 2019-03-25 DIAGNOSIS — F4323 Adjustment disorder with mixed anxiety and depressed mood: Secondary | ICD-10-CM

## 2019-03-25 MED ORDER — SERTRALINE HCL 100 MG PO TABS: ORAL_TABLET | ORAL | 0 refills | Status: DC

## 2019-03-25 NOTE — Telephone Encounter (Signed)
Refill request sent from [facility] via fax

## 2019-04-08 ENCOUNTER — Other Ambulatory Visit: Payer: Self-pay

## 2019-04-08 ENCOUNTER — Ambulatory Visit (INDEPENDENT_AMBULATORY_CARE_PROVIDER_SITE_OTHER): Payer: BC Managed Care – PPO | Admitting: Family Medicine

## 2019-04-08 ENCOUNTER — Encounter: Payer: Self-pay | Admitting: Family Medicine

## 2019-04-08 VITALS — BP 138/82 | HR 84 | Ht 71.0 in | Wt 220.0 lb

## 2019-04-08 DIAGNOSIS — I10 Essential (primary) hypertension: Secondary | ICD-10-CM

## 2019-04-08 DIAGNOSIS — E785 Hyperlipidemia, unspecified: Secondary | ICD-10-CM

## 2019-04-08 DIAGNOSIS — F39 Unspecified mood [affective] disorder: Secondary | ICD-10-CM

## 2019-04-08 DIAGNOSIS — R7302 Impaired glucose tolerance (oral): Secondary | ICD-10-CM

## 2019-04-08 DIAGNOSIS — E781 Pure hyperglyceridemia: Secondary | ICD-10-CM

## 2019-04-08 DIAGNOSIS — E786 Lipoprotein deficiency: Secondary | ICD-10-CM

## 2019-04-08 DIAGNOSIS — Z716 Tobacco abuse counseling: Secondary | ICD-10-CM

## 2019-04-08 DIAGNOSIS — Z720 Tobacco use: Secondary | ICD-10-CM | POA: Diagnosis not present

## 2019-04-08 DIAGNOSIS — M159 Polyosteoarthritis, unspecified: Secondary | ICD-10-CM

## 2019-04-08 NOTE — Progress Notes (Signed)
Telehealth office visit note for Aaron Cooper, D.O- at Primary Care at [facility]   I connected with current patient today and verified that I am speaking with the correct person using two identifiers.   . Location of the patient: Home . Location of the provider: Office Only the patient (+/- their family members at pt's discretion) and myself were participating in the encounter - This visit type was conducted due to national recommendations for restrictions regarding the COVID-19 Pandemic (e.g. social distancing) in an effort to limit this patient's exposure and mitigate transmission in our community.  This format is felt to be most appropriate for this patient at this time.   - The patient did not have access to video technology or had technical difficulties with video requiring transitioning to audio format only. - No physical exam could be performed with this format, beyond that communicated to [place] by the patient/ family members as noted.   - Additionally my office staff/ schedulers discussed with the patient that there may be a monetary charge related to this service, depending on their medical insurance.   The patient expressed understanding, and agreed to proceed.       History of Present Illness:  Notes working Sunday through Friday now due to changes at work.  Congested today but says he's been feeling okay; "probably just sinuses or whatever."  Confirms feeling okay overall.  - Mood Management Says his mood "has its moments, but nowhere near where it was a few years ago."  He continues all of his meds and tolerating them well.  - Tobacco Use Says he has cut back some on smoking.  - Generalized Osteoarthritis Says he continues meloxicam, and "with age, everything seems it's starting to get a little bit worse."  Says he can tell a big difference in his hands when he takes it vs doesn't.  HPI:  Hypertension:  -  His blood pressure at home has been running:  138/82, "something like that."  He checks his blood pressure a couple of times per week.  "Usually after working 12 hour days, by the time I get home, it's done."  Thinks his pulse is in the 80's normally.  - Patient reports good compliance with medication and/or lifestyle modification.  Believes the change in BP med last visit has helped with his BP overall.  - His denies acute concerns or problems related to treatment plan  - He denies new onset of: chest pain, exercise intolerance, shortness of breath, dizziness, visual changes, headache, lower extremity swelling or claudication.   Last 3 blood pressure readings in our office are as follows: BP Readings from Last 3 Encounters:  04/08/19 138/82  01/07/19 (!) 159/79  06/20/18 137/86   Filed Weights   04/08/19 0926  Weight: 220 lb (99.8 kg)    HPI:  Hyperlipidemia:  53 y.o. male here for cholesterol follow-up.   - Patient reports good compliance with treatment plan of:  medication and/ or lifestyle management.    - Patient denies any acute concerns or problems with management plan   - He denies new onset of: myalgias, arthralgias, increased fatigue more than normal, chest pains, exercise intolerance, shortness of breath, dizziness, visual changes, headache, lower extremity swelling or claudication.   Most recent cholesterol panel was:  Lab Results  Component Value Date   CHOL 230 (H) 01/07/2019   HDL 35 (L) 01/07/2019   LDLCALC 154 (H) 01/07/2019   TRIG 220 (H) 01/07/2019  CHOLHDL 5.7 (H) 10/02/2017   Hepatic Function Latest Ref Rng & Units 01/07/2019 10/02/2017 06/03/2017  Total Protein 6.0 - 8.5 g/dL 7.0 6.7 7.9  Albumin 3.8 - 4.9 g/dL 4.6 4.4 4.9  AST 0 - 40 IU/L 17 18 18   ALT 0 - 44 IU/L 21 19 22   Alk Phosphatase 39 - 117 IU/L 128(H) 119(H) 118(H)  Total Bilirubin 0.0 - 1.2 mg/dL 0.2 0.2 0.3      GAD 7 : Generalized Anxiety Score 09/25/2018 06/20/2018 03/20/2016  Nervous, Anxious, on Edge 0 2 0  Control/stop  worrying 0 - 0  Worry too much - different things 0 2 3  Trouble relaxing 0 3 0  Restless 0 1 0  Easily annoyed or irritable 1 3 0  Afraid - awful might happen 0 2 0  Total GAD 7 Score 1 - 3  Anxiety Difficulty Not difficult at all Very difficult Not difficult at all    Depression screen [facility] 2/9 04/08/2019 01/07/2019 09/25/2018 06/20/2018 03/07/2018  Decreased Interest 0 0 0 1 1  Down, Depressed, Hopeless 0 0 0 1 1  PHQ - 2 Score 0 0 0 2 2  Altered sleeping 0 0 0 0 1  Tired, decreased energy 1 0 0 1 1  Change in appetite 0 0 0 1 0  Feeling bad or failure about yourself  0 0 0 0 0  Trouble concentrating 0 0 0 0 0  Moving slowly or fidgety/restless 0 0 0 1 0  Suicidal thoughts 0 0 0 0 0  PHQ-9 Score 1 0 0 5 4  Difficult doing work/chores Not difficult at all Not difficult at all Not difficult at all Very difficult Somewhat difficult  Some recent data might be hidden      Impression and Recommendations:    1. Essential hypertension   2. Hyperlipidemia, unspecified hyperlipidemia type   3. Mood disorder ([place])   4. Tobacco abuse   5. Tobacco abuse counseling   6. Generalized osteoarthritis of multiple sites   7. Low level of high density lipoprotein (HDL)   8. Hypertriglyceridemia   9. Glucose intolerance (impaired glucose tolerance)       Essential Hypertension - Reviewed goal of under 140/90 - Blood pressure currently is at goal  - Patient has continued taking combination pill. - Per patient, believes his BP is under better control since med change. - Patient will continue current treatment regimen  - Counseled patient on pathophysiology of disease and discussed various treatment options, which always includes dietary and lifestyle modification as first line.   - Lifestyle changes such as dash and heart healthy diets and engaging in a regular exercise program discussed extensively with patient.   - Ambulatory blood pressure monitoring encouraged at least 3 times weekly.   Keep log and bring in every office visit.  Reminded patient that if they ever feel poorly in any way, to check their blood pressure and pulse.  - Handouts provided at patient's desire and/or told to go online at the [place] website for further information  - We will continue to monitor  Hyperlipidemia, Hypertriglyceridemia, Low HDL - Managed on statin. - Continue management as established.  The 10-year ASCVD risk score Mikey Bussing DC Jr., et al., 2013) is: 17.7%   Values used to calculate the score:     Age: 9 years     Sex: Male     Is Non-Hispanic African American: No     Diabetic: No  Tobacco smoker: Yes     Systolic Blood Pressure: 100 mmHg     Is BP treated: No     HDL Cholesterol: 35 mg/dL     Total Cholesterol: 230 mg/dL  - Reviewed that patient's ASCVD risk remains significant.  - Discussed recommendation for referral to cardiology for specialized cholesterol management. - Ambulatory referral to cardiology provided today.  See orders. - Per patient, [place] is closest.  Dietary changes such as low saturated & trans fat diets for hyperlipidemia and low carb/ ketogenic diets for hypertriglyceridemia discussed with patient.    Encouraged patient to follow AHA guidelines for regular exercise and also engage in weight loss if BMI above 25.   Educational handouts provided at patient's desire and/ or told to look online at the [employer] website for further information.  We will continue to monitor and re-check as recommended.  Tobacco Abuse, Tobacco Abuse Counseling  - Encouraged patient to discontinue smoking. - Patient declines options for assistance today.  - Reviewed health risks of ongoing tobacco abuse. - Education provided and all questions answered. - Will continue to monitor and encourage patient to engage in more prudent habits.  Mood Disorder - Stable at this time on current management. - Continue treatment plan as  established.  See med list. - Patient tolerating medications well. - Will continue to monitor  Generalized Osteoarthritis of Multiple Sites - Managed well and stable on meloxicam. - Continue treatment plan as established. - Will continue to monitor.  Lifestyle & Preventative Health Maintenance - Advised patient to continue working toward exercising to improve overall mental, physical, and emotional health.    - Reviewed the "spokes of the wheel" of mood and health management.  Stressed the importance of ongoing prudent habits, including regular exercise, appropriate sleep hygiene, healthful dietary habits, and prayer/meditation to relax.  - Encouraged patient to engage in daily physical activity, especially a formal exercise routine.  Recommended that the patient eventually strive for at least 150 minutes of moderate cardiovascular activity per week according to guidelines established by the [facility].   - Healthy dietary habits encouraged, including low-carb, and high amounts of lean protein in diet.   - Patient should also consume adequate amounts of water.  - Health counseling performed.  All questions answered.    - As part of my medical decision making, I reviewed the following data within the [place] History obtained from pt /family, CMA notes reviewed and incorporated if applicable, Labs reviewed, Radiograph/ tests reviewed if applicable and OV notes from prior OV's with me, as well as other specialists she/he has seen since seeing me last, were all reviewed and used in my medical decision making process today.    - Additionally, discussion had with patient regarding our treatment plan, and their biases/concerns about that plan were used in my medical decision making today.    - The patient agreed with the plan and demonstrated an understanding of the instructions.   No barriers to understanding were identified.    - Red flag symptoms and signs discussed in detail.   Patient expressed understanding regarding what to do in case of emergency\ urgent symptoms.   - The patient was advised to call back or seek an in-person evaluation if the symptoms worsen or if the condition fails to improve as anticipated.   Return for f/up 4 months; f/up with cardiology near future.    Orders Placed This Encounter  Procedures  . Ambulatory referral to Cardiology  I provided 20+ minutes of non face-to-face time during this encounter.  Additional time was spent with charting and coordination of care after the actual visit commenced.   Note:  This note was prepared with assistance of Dragon voice recognition software. Occasional wrong-word or sound-a-like substitutions may have occurred due to the inherent limitations of voice recognition software.   This document serves as a record of services personally performed by Aaron Dance, DO. It was created on her behalf by Toni Amend, a trained medical scribe. The creation of this record is based on the scribe's personal observations and the provider's statements to them.   This case required medical decision making of at least moderate complexity. The above documentation has been reviewed to be accurate and was completed by Marjory Sneddon, D.O.      Patient Care Team    Relationship Specialty Notifications Start End  Aaron Dance, DO PCP - General Family Medicine  11/09/15      -Vitals obtained; medications/ allergies reconciled;  personal medical, social, Sx etc.histories were updated by CMA, reviewed by me and are reflected in chart   Patient Active Problem List   Diagnosis Date Noted  . HTN (hypertension) 11/09/2015    Priority: High  . HLD (hyperlipidemia) 11/09/2015    Priority: High  . Mood disorder ([place]) 11/09/2015    Priority: Medium  . Vitamin D deficiency 11/09/2015    Priority: Medium  . Tobacco abuse 11/20/2016    Priority: Low  . Tobacco abuse counseling 11/20/2016     Priority: Low  . Hypertriglyceridemia 04/08/2019  . Glucose intolerance (impaired glucose tolerance) 03/14/2018  . Low level of high density lipoprotein (HDL) 03/14/2018  . Occupational exposure to chemicals-patient served at [employer] from 1984 through 87 06/03/2017  . Contact with and (suspected) exposure to other hazardous, chiefly nonmedicinal, chemicals 06/03/2017  . Adjustment disorder with mixed anxiety and depressed mood 02/14/2016  . Fatigue 11/09/2015  . Generalized osteoarthritis of multiple sites 11/09/2015  . 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 Obesity (BMI 30-39.9) 11/09/2015     Current Meds  Medication Sig  . amLODIPine-Valsartan-HCTZ 5-160-12.5 MG TABS 1 po qd  . aspirin 325 MG tablet Take 325 mg by mouth daily.  Marland Kitchen atorvastatin (LIPITOR) 80 MG tablet Take 80 mg nightly before bedtime.  . lamoTRIgine (LAMICTAL) 100 MG tablet TAKE 1/2 TABLET(50 MG) BY MOUTH TWICE DAILY  . meloxicam (MOBIC) 15 MG tablet TAKE 1 TABLET(15 MG) BY MOUTH DAILY  . niacin 500 MG CR capsule Take 1 capsule (500 mg total) by mouth 2 (two) times daily with a meal.  . sertraline (ZOLOFT) 100 MG tablet TAKE 1 TABLET(100 MG) BY MOUTH DAILY  . Vitamin D, Ergocalciferol, (DRISDOL) 1.25 MG (50000 UT) CAPS capsule Take one tablet wkly     Allergies:  No Known Allergies   ROS:  See above HPI for pertinent positives and negatives   Objective:   Blood pressure 138/82, pulse 84, height 5' 11"  (1.803 m), weight 220 lb (99.8 kg).  (if some vitals are omitted, this means that patient was UNABLE to obtain them even though they were asked to get them prior to OV today.  They were asked to call us at their earliest convenience with these once obtained. )  General: A & O * 3; sounds in no acute distress; in usual state of health.  Skin: Pt confirms warm and dry extremities and pink fingertips HEENT: Pt confirms lips non-cyanotic Chest: Patient confirms normal chest excursion and movement  Respiratory:  speaking in full sentences, no conversational dyspnea; patient confirms no use of accessory muscles Psych: insight appears good, mood- appears full

## 2019-10-21 ENCOUNTER — Ambulatory Visit (INDEPENDENT_AMBULATORY_CARE_PROVIDER_SITE_OTHER): Payer: BC Managed Care – PPO | Admitting: Physician Assistant

## 2019-10-21 ENCOUNTER — Encounter: Payer: Self-pay | Admitting: Physician Assistant

## 2019-10-21 ENCOUNTER — Other Ambulatory Visit: Payer: Self-pay

## 2019-10-21 VITALS — BP 191/102 | HR 77 | Temp 97.6°F | Ht 70.0 in | Wt 219.2 lb

## 2019-10-21 DIAGNOSIS — F4323 Adjustment disorder with mixed anxiety and depressed mood: Secondary | ICD-10-CM | POA: Diagnosis not present

## 2019-10-21 DIAGNOSIS — I10 Essential (primary) hypertension: Secondary | ICD-10-CM

## 2019-10-21 DIAGNOSIS — E782 Mixed hyperlipidemia: Secondary | ICD-10-CM

## 2019-10-21 DIAGNOSIS — M159 Polyosteoarthritis, unspecified: Secondary | ICD-10-CM

## 2019-10-21 DIAGNOSIS — E559 Vitamin D deficiency, unspecified: Secondary | ICD-10-CM

## 2019-10-21 DIAGNOSIS — F39 Unspecified mood [affective] disorder: Secondary | ICD-10-CM

## 2019-10-21 MED ORDER — LAMOTRIGINE 100 MG PO TABS: 50.0000 mg | ORAL_TABLET | Freq: Two times a day (BID) | ORAL | 0 refills | Status: DC

## 2019-10-21 MED ORDER — ATORVASTATIN CALCIUM 80 MG PO TABS: ORAL_TABLET | ORAL | 0 refills | Status: DC

## 2019-10-21 MED ORDER — VITAMIN D (ERGOCALCIFEROL) 1.25 MG (50000 UNIT) PO CAPS: ORAL_CAPSULE | ORAL | 6 refills | Status: DC

## 2019-10-21 MED ORDER — MELOXICAM 15 MG PO TABS: 15.0000 mg | ORAL_TABLET | Freq: Every day | ORAL | 0 refills | Status: DC

## 2019-10-21 MED ORDER — SERTRALINE HCL 100 MG PO TABS: ORAL_TABLET | ORAL | 0 refills | Status: DC

## 2019-10-21 MED ORDER — AMLODIPINE-VALSARTAN-HCTZ 5-160-12.5 MG PO TABS: ORAL_TABLET | ORAL | 0 refills | Status: DC

## 2019-10-21 NOTE — Progress Notes (Signed)
Established Patient Office Visit  Subjective:  Patient ID: Aaron Cooper, male    DOB: 09-Oct-1965  Age: 54 y.o. MRN: [id number]  CC:  Chief Complaint  Patient presents with   Hypertension   Hyperlipidemia    HPI Aaron Cooper presents for follow-up on hypertension and hyperlipidemia. Patient reports he has been out of his medications for about 2 months and a half.  States he just forgot to request more refills.  He has been busy working 72 hours a week.  HTN: Pt denies chest pain, palpitations, shortness of breath, dizziness or lower extremity swelling. Does not check BP at home. Pt follows a low salt diet.   HLD: Pt needs refill of medication. Denies side effects including myalgias and RUQ pain. Reports poor compliance with following heart healthy diet.  Osteoarthritis: Patient requesting refill of meloxicam.  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  Mood disorder, adjustment disorder with mixed anxiety and depression: Patient reports his mood is good.  Requesting refills of Zoloft and Lamictal.  Vitamin D deficiency: Patient requesting refill of vitamin D.  Asymptomatic.  Past Medical History:  Diagnosis Date   Arthritis    Depression    Fatigue 11/09/2015   HLD (hyperlipidemia) 11/09/2015   HTN (hypertension) 11/09/2015   Mood disorder in conditions classified elsewhere 11/09/2015   Obesity (BMI 30-39.9) 11/09/2015   Overweight (BMI 25.0-29.9) 11/09/2015   Vitamin D insufficiency 11/09/2015    Past Surgical History:  Procedure Laterality Date   BACK SURGERY  2013   HAND SURGERY Right    KNEE SURGERY Right 1995    Family History  Problem Relation Age of Onset   Diabetes Mother    Hyperlipidemia Mother    Heart attack Father    Hyperlipidemia Father    Hypertension Father    Cancer Sister        breast   Stroke Maternal Aunt    Alcohol abuse Maternal Grandmother    Heart attack Brother    Alcohol abuse  Brother     Social History   Socioeconomic History   Marital status: Married    Spouse name: Not on file   Number of children: Not on file   Years of education: Not on file   Highest education level: Not on file  Occupational History   Not on file  Tobacco Use   Smoking status: Current Every Day Smoker    Packs/day: 1.50    Years: 25.00    Pack years: 37.50   Smokeless tobacco: Never Used   Tobacco comment: Pt refused cessation material  Vaping Use   Vaping Use: Never used  Substance and Sexual Activity   Alcohol use: No   Drug use: No   Sexual activity: Yes  Other Topics Concern   Not on file  Social History Narrative   Not on file   Social Determinants of Health   Financial Resource Strain:    Difficulty of Paying Living Expenses:   Food Insecurity:    Worried About Charity fundraiser in the Last Year:    Arboriculturist in the Last Year:   Transportation Needs:    Film/video editor (Medical):    Lack of Transportation (Non-Medical):   Physical Activity:    Days of Exercise per Week:    Minutes of Exercise per Session:   Stress:    Feeling of Stress :   Social Connections:    Frequency  of Communication with Friends and Family:    Frequency of Social Gatherings with Friends and Family:    Attends Religious Services:    Active Member of Clubs or Organizations:    Attends Engineer, structural:    Marital Status:   Intimate Partner Violence:    Fear of Current or Ex-Partner:    Emotionally Abused:    Physically Abused:    Sexually Abused:     Outpatient Medications Prior to Visit  Medication Sig Dispense Refill   aspirin 325 MG tablet Take 325 mg by mouth daily.     niacin 500 MG CR capsule Take 1 capsule (500 mg total) by mouth 2 (two) times daily with a meal. 180 capsule 0   amLODIPine-Valsartan-HCTZ 5-160-12.5 MG TABS 1 po qd 90 tablet 0   atorvastatin (LIPITOR) 80 MG tablet Take 80 mg nightly before  bedtime. 90 tablet 1   lamoTRIgine (LAMICTAL) 100 MG tablet TAKE 1/2 TABLET(50 MG) BY MOUTH TWICE DAILY 90 tablet 1   meloxicam (MOBIC) 15 MG tablet TAKE 1 TABLET(15 MG) BY MOUTH DAILY 90 tablet 0   sertraline (ZOLOFT) 100 MG tablet TAKE 1 TABLET(100 MG) BY MOUTH DAILY 90 tablet 0   Vitamin D, Ergocalciferol, (DRISDOL) 1.25 MG (50000 UT) CAPS capsule Take one tablet wkly 12 capsule 10   ranitidine (ZANTAC) 75 MG tablet Take 1 tablet (75 mg total) by mouth 2 (two) times daily. (Patient not taking: Reported on 04/08/2019)     No facility-administered medications prior to visit.    No Known Allergies  ROS Review of Systems  General:   Denies fever, chills, unexplained weight loss.  Optho/Auditory:   Denies visual changes, blurred vision/LOV Respiratory:   Denies SOB, DOE more than baseline levels, cough Cardiovascular:   Denies chest pain, palpitations, new onset peripheral edema  Gastrointestinal:   Denies nausea, vomiting, diarrhea.  Genitourinary: Denies dysuria, freq/ urgency, flank pain Endocrine:     Denies hot or cold intolerance, polyuria, polydipsia. Musculoskeletal:   Denies unexplained myalgias, joint swelling, unexplained arthralgias, gait problems.  Skin:  Denies rash, suspicious lesions Neurological:     Denies dizziness, unexplained weakness, numbness  Psychiatric/Behavioral:   Denies mood changes, suicidal or homicidal ideations, hallucinations     Objective:    Physical Exam General: Well nourished, in no apparent distress. Eyes: PERRLA, EOMs, conjunctiva clr Resp: Respiratory effort- normal, ECTA B/L w/o W/R/R  Cardio: RRR w/o MRGs. Abdomen: no gross distention. Lymphatics:  less 2 sec cap RF.  No edema present M-sk: Full ROM, good strength, normal gait.  Skin: Warm, dry  Neuro: Alert, Oriented, no focal deficits Psych: Normal affect, Insight and Judgment appropriate.   BP (!) 191/102    Pulse 77    Temp 97.6 F (36.4 C) (Oral)    Ht 5\' 10"  (1.778 m)     Wt 219 lb 3.2 oz (99.4 kg)    SpO2 95%    BMI 31.45 kg/m  Wt Readings from Last 3 Encounters:  10/21/19 219 lb 3.2 oz (99.4 kg)  04/08/19 220 lb (99.8 kg)  01/07/19 217 lb (98.4 kg)     Health Maintenance Due  Topic Date Due   Hepatitis C Screening  Never done   COVID-19 Vaccine (1) Never done   COLONOSCOPY  Never done    There are no preventive care reminders to display for this patient.  Lab Results  Component Value Date   TSH 1.180 01/07/2019   Lab Results  Component Value Date  WBC 9.5 01/07/2019   HGB 15.4 01/07/2019   HCT 45.6 01/07/2019   MCV 90 01/07/2019   PLT 229 01/07/2019   Lab Results  Component Value Date   NA 135 01/07/2019   K 4.5 01/07/2019   CO2 20 01/07/2019   GLUCOSE 90 01/07/2019   BUN 16 01/07/2019   CREATININE 0.78 01/07/2019   BILITOT 0.2 01/07/2019   ALKPHOS 128 (H) 01/07/2019   AST 17 01/07/2019   ALT 21 01/07/2019   PROT 7.0 01/07/2019   ALBUMIN 4.6 01/07/2019   CALCIUM 9.1 01/07/2019   Lab Results  Component Value Date   CHOL 230 (H) 01/07/2019   Lab Results  Component Value Date   HDL 35 (L) 01/07/2019   Lab Results  Component Value Date   LDLCALC 154 (H) 01/07/2019   Lab Results  Component Value Date   TRIG 220 (H) 01/07/2019   Lab Results  Component Value Date   CHOLHDL 5.7 (H) 10/02/2017   Lab Results  Component Value Date   HGBA1C 5.4 01/07/2019      Assessment & Plan:   Problem List Items Addressed This Visit      Cardiovascular and Mediastinum   HTN (hypertension) (Chronic)   Relevant Medications   atorvastatin (LIPITOR) 80 MG tablet     Musculoskeletal and Integument   Generalized osteoarthritis of multiple sites   Relevant Medications   meloxicam (MOBIC) 15 MG tablet     Other   Mood disorder (HCC) (Chronic)   Relevant Medications   lamoTRIgine (LAMICTAL) 100 MG tablet   HLD (hyperlipidemia) (Chronic)   Relevant Medications   atorvastatin (LIPITOR) 80 MG tablet   Adjustment disorder  with mixed anxiety and depressed mood (Chronic)   Relevant Medications   lamoTRIgine (LAMICTAL) 100 MG tablet   sertraline (ZOLOFT) 100 MG tablet    Other Visit Diagnoses    Vitamin D insufficiency       Relevant Medications   Vitamin D, Ergocalciferol, (DRISDOL) 1.25 MG (50000 UNIT) CAPS capsule     HTN: - BP today is 179/90 heart rate 77, recheck BP 191/102, above goal and expected given patient has been out of medication for a few months.  Asymptomatic. - Discussed with patient the importance of medication compliance to avoid possible complications from uncontrolled hypertension and other chronic conditions. Patient verbalized understanding. - Continue amlodipine-valsartan-HCTZ 5-160-12.5.  Provided refills. - Encourage ambulatory BP and pulse monitoring and keep a log.  If blood pressure consistently above 130/80 notify the clinic, and will make medication adjustments. - Continue DASH diet. - Encourage to stay as active as possible.  HLD: - Last lipid panel: Total cholesterol, triglycerides, LDL elevated. - Continue atorvastatin 80 mg and niacin 500 mg. - Follow heart healthy diet. - Plan to recheck lipid panel and hepatic function at next office visit given patient has been out of his medication for a few months.  Mood disorder, Adjustment disorder with mixed anxiety and depression: -Stable, PHQ-9 score of 3 -Continue Zoloft 100 mg and Lamictal 50 mg twice daily. Provided refills.  Osteoarthritis:  -Last CMP: Kidney function within normal limits -Provided refill of meloxicam. -Recommended to use topical anti-inflammatory such as Voltaren gel.  Vitamin D deficiency: -Provided a refill of vitamin D 50,000 units. -Plan to recheck vitamin D at next office visit.   Meds ordered this encounter  Medications   DISCONTD: amLODIPine-Valsartan-HCTZ 5-160-12.5 MG TABS    Sig: 1 po qd    Dispense:  90 tablet    Refill:  0   atorvastatin (LIPITOR) 80 MG tablet    Sig: Take 80  mg nightly before bedtime.    Dispense:  90 tablet    Refill:  0   lamoTRIgine (LAMICTAL) 100 MG tablet    Sig: Take 0.5 tablets (50 mg total) by mouth 2 (two) times daily.    Dispense:  90 tablet    Refill:  0   meloxicam (MOBIC) 15 MG tablet    Sig: Take 1 tablet (15 mg total) by mouth daily.    Dispense:  90 tablet    Refill:  0   sertraline (ZOLOFT) 100 MG tablet    Sig: TAKE 1 TABLET(100 MG) BY MOUTH DAILY    Dispense:  90 tablet    Refill:  0   Vitamin D, Ergocalciferol, (DRISDOL) 1.25 MG (50000 UNIT) CAPS capsule    Sig: Take one tablet wkly    Dispense:  12 capsule    Refill:  6    Follow-up: Return in about 3 months (around 01/21/2020) for HTN, HLD, Mood and FBW (lipid panel, cmp, vit d).    Mayer Masker, PA-C

## 2019-10-21 NOTE — Patient Instructions (Signed)

## 2019-11-02 ENCOUNTER — Other Ambulatory Visit: Payer: Self-pay | Admitting: Physician Assistant

## 2019-11-02 DIAGNOSIS — I10 Essential (primary) hypertension: Secondary | ICD-10-CM

## 2019-11-02 MED ORDER — AMLODIPINE-VALSARTAN-HCTZ 5-160-12.5 MG PO TABS: ORAL_TABLET | ORAL | 0 refills | Status: DC

## 2019-11-11 ENCOUNTER — Telehealth: Payer: Self-pay

## 2019-11-11 MED ORDER — AMLODIPINE BESYLATE-VALSARTAN 5-160 MG PO TABS: 1.0000 | ORAL_TABLET | Freq: Every day | ORAL | 0 refills | Status: DC

## 2019-11-11 MED ORDER — HYDROCHLOROTHIAZIDE 12.5 MG PO CAPS: 12.5000 mg | ORAL_CAPSULE | Freq: Every day | ORAL | 0 refills | Status: DC

## 2019-11-11 NOTE — Telephone Encounter (Signed)
Aaron Cooper with [facility] [address] called stating that the amlodipine/Valsartan/HCTZ combo pill is unavailable and requesting new RXs to substitute for this medication.  RXs sent to pharmacy.  Tiajuana Amass, CMA

## 2020-01-21 ENCOUNTER — Ambulatory Visit: Payer: BC Managed Care – PPO | Admitting: Physician Assistant

## 2020-01-22 ENCOUNTER — Other Ambulatory Visit: Payer: Self-pay | Admitting: Physician Assistant

## 2020-01-22 DIAGNOSIS — E782 Mixed hyperlipidemia: Secondary | ICD-10-CM

## 2020-01-22 DIAGNOSIS — F4323 Adjustment disorder with mixed anxiety and depressed mood: Secondary | ICD-10-CM

## 2020-01-22 DIAGNOSIS — F39 Unspecified mood [affective] disorder: Secondary | ICD-10-CM

## 2020-01-22 DIAGNOSIS — M159 Polyosteoarthritis, unspecified: Secondary | ICD-10-CM

## 2020-01-26 ENCOUNTER — Other Ambulatory Visit: Payer: Self-pay | Admitting: Physician Assistant

## 2020-01-26 DIAGNOSIS — F4323 Adjustment disorder with mixed anxiety and depressed mood: Secondary | ICD-10-CM

## 2020-01-26 MED ORDER — SERTRALINE HCL 100 MG PO TABS: ORAL_TABLET | ORAL | 0 refills | Status: DC

## 2020-02-09 DIAGNOSIS — Z20822 Contact with and (suspected) exposure to covid-19: Secondary | ICD-10-CM | POA: Diagnosis not present

## 2020-02-09 DIAGNOSIS — I160 Hypertensive urgency: Secondary | ICD-10-CM | POA: Diagnosis not present

## 2020-02-18 ENCOUNTER — Telehealth: Payer: Self-pay | Admitting: Physician Assistant

## 2020-02-18 MED ORDER — HYDROCHLOROTHIAZIDE 12.5 MG PO CAPS: 12.5000 mg | ORAL_CAPSULE | Freq: Every day | ORAL | 0 refills | Status: DC

## 2020-02-18 NOTE — Telephone Encounter (Signed)
Please all pt to schedule apt for further refills. AS< CMA

## 2020-02-23 ENCOUNTER — Ambulatory Visit (INDEPENDENT_AMBULATORY_CARE_PROVIDER_SITE_OTHER): Payer: BC Managed Care – PPO | Admitting: Physician Assistant

## 2020-02-23 ENCOUNTER — Encounter: Payer: Self-pay | Admitting: Physician Assistant

## 2020-02-23 ENCOUNTER — Other Ambulatory Visit: Payer: Self-pay

## 2020-02-23 VITALS — BP 153/71 | HR 65 | Temp 97.7°F | Ht 70.0 in | Wt 213.5 lb

## 2020-02-23 DIAGNOSIS — I10 Essential (primary) hypertension: Secondary | ICD-10-CM

## 2020-02-23 DIAGNOSIS — Z0000 Encounter for general adult medical examination without abnormal findings: Secondary | ICD-10-CM

## 2020-02-23 DIAGNOSIS — E782 Mixed hyperlipidemia: Secondary | ICD-10-CM

## 2020-02-23 DIAGNOSIS — Z2821 Immunization not carried out because of patient refusal: Secondary | ICD-10-CM

## 2020-02-23 DIAGNOSIS — Z716 Tobacco abuse counseling: Secondary | ICD-10-CM | POA: Diagnosis not present

## 2020-02-23 MED ORDER — AMLODIPINE BESYLATE-VALSARTAN 5-320 MG PO TABS: 1.0000 | ORAL_TABLET | Freq: Every day | ORAL | 1 refills | Status: DC

## 2020-02-23 NOTE — Patient Instructions (Signed)

## 2020-02-23 NOTE — Assessment & Plan Note (Signed)
-  BP elevated today so will increase amlodipine-valsartan to 5-320 mg. -Advised to monitor BP and pulse at home. -Continue low-sodium diet and stay well-hydrated. -Checking CMP today for medication monitoring. -Will continue to monitor.

## 2020-02-23 NOTE — Progress Notes (Signed)
Established Patient Office Visit  Subjective:  Patient ID: Aaron Cooper, male    DOB: December 03, 1965  Age: 54 y.o. MRN: [id number]  CC:  Chief Complaint  Patient presents with  . Hyperlipidemia  . Hypertension    HPI Aaron Cooper presents for chronic follow up on hyperlipidemia and hypertension.  HTN: Pt denies chest pain, palpitations, dizziness, shortness of breath or lower extremity swelling. Taking medication as directed without side effects.  Doesn't check blood pressure at home but has felt his blood pressure has been elevated. Reports an episode of ear ringing where he felt he could "hear his heartbeat". Follows a low-salt diet.  HLD: Pt taking medication as directed without issues. Denies side effects.  Reports there has been some dietary changes and is eating more lean meats, vegetables and reduced fried foods.  Tobacco use: States he continues to smoke about 1-1.5 PPD.   Past Medical History:  Diagnosis Date  . Arthritis   . Depression   . Fatigue 11/09/2015  . HLD (hyperlipidemia) 11/09/2015  . HTN (hypertension) 11/09/2015  . Mood disorder in conditions classified elsewhere 11/09/2015  . Obesity (BMI 30-39.9) 11/09/2015  . Overweight (BMI 25.0-29.9) 11/09/2015  . Vitamin D insufficiency 11/09/2015    Past Surgical History:  Procedure Laterality Date  . BACK SURGERY  2013  . HAND SURGERY Right   . KNEE SURGERY Right 1995    Family History  Problem Relation Age of Onset  . Diabetes Mother   . Hyperlipidemia Mother   . Heart attack Father   . Hyperlipidemia Father   . Hypertension Father   . Cancer Sister        breast  . Stroke Maternal Aunt   . Alcohol abuse Maternal Grandmother   . Heart attack Brother   . Alcohol abuse Brother     Social History   Socioeconomic History  . Marital status: Married    Spouse name: Not on file  . Number of children: Not on file  . Years of education: Not on file  . Highest education level: Not on file  Occupational  History  . Not on file  Tobacco Use  . Smoking status: Current Every Day Smoker    Packs/day: 1.50    Years: 25.00    Pack years: 37.50  . Smokeless tobacco: Never Used  . Tobacco comment: Pt refused cessation material  Vaping Use  . Vaping Use: Never used  Substance and Sexual Activity  . Alcohol use: No  . Drug use: No  . Sexual activity: Yes  Other Topics Concern  . Not on file  Social History Narrative  . Not on file   Social Determinants of Health   Financial Resource Strain:   . Difficulty of Paying Living Expenses: Not on file  Food Insecurity:   . Worried About Charity fundraiser in the Last Year: Not on file  . Ran Out of Food in the Last Year: Not on file  Transportation Needs:   . Lack of Transportation (Medical): Not on file  . Lack of Transportation (Non-Medical): Not on file  Physical Activity:   . Days of Exercise per Week: Not on file  . Minutes of Exercise per Session: Not on file  Stress:   . Feeling of Stress : Not on file  Social Connections:   . Frequency of Communication with Friends and Family: Not on file  . Frequency of Social Gatherings with Friends and Family: Not on file  . Attends  Religious Services: Not on file  . Active Member of Clubs or Organizations: Not on file  . Attends Archivist Meetings: Not on file  . Marital Status: Not on file  Intimate Partner Violence:   . Fear of Current or Ex-Partner: Not on file  . Emotionally Abused: Not on file  . Physically Abused: Not on file  . Sexually Abused: Not on file    Outpatient Medications Prior to Visit  Medication Sig Dispense Refill  . aspirin 325 MG tablet Take 325 mg by mouth daily.    Marland Kitchen atorvastatin (LIPITOR) 80 MG tablet Take 1 tablet (80 mg total) by mouth daily. TAKE 1 TABLET BY MOUTH EVERY NIGHT AT BEDTIME**NEEDS APT FOR FURTHER REFILLS** 60 tablet 0  . hydrochlorothiazide (MICROZIDE) 12.5 MG capsule Take 1 capsule (12.5 mg total) by mouth daily. **NEEDS APT FOR  FURTHER REFILLS** 60 capsule 0  . lamoTRIgine (LAMICTAL) 100 MG tablet Take 0.5 tablets (50 mg total) by mouth 2 (two) times daily. **NEEDS APT FOR FURTHER REFILLS** 60 tablet 0  . meloxicam (MOBIC) 15 MG tablet Take 1 tablet (15 mg total) by mouth daily. **NEEDS APT FOR FURTHER REFILLS** 60 tablet 0  . niacin 500 MG CR capsule Take 1 capsule (500 mg total) by mouth 2 (two) times daily with a meal. 180 capsule 0  . sertraline (ZOLOFT) 100 MG tablet TAKE 1 TABLET(100 MG) BY MOUTH DAILY 90 tablet 0  . Vitamin D, Ergocalciferol, (DRISDOL) 1.25 MG (50000 UNIT) CAPS capsule Take one tablet wkly 12 capsule 6  . amLODipine-valsartan (EXFORGE) 5-160 MG tablet Take 1 tablet by mouth daily. 90 tablet 0   No facility-administered medications prior to visit.    No Known Allergies  ROS Review of Systems A fourteen system review of systems was performed and found to be positive as per HPI.  Objective:    Physical Exam General:  Well Developed, well nourished, appropriate for stated age.  Neuro:  Alert and oriented,  extra-ocular muscles intact, no focal deficits  HEENT:  Normocephalic, atraumatic, neck supple Skin:  no gross rash, warm, pink. Cardiac:  RRR, S1 S2, no murmur Respiratory:  ECTA B/L and A/P, Not using accessory muscles, speaking in full sentences- unlabored. Vascular:  Ext warm, no cyanosis apprec.; cap RF less 2 sec. Psych:  No HI/SI, judgement and insight good, Euthymic mood. Full Affect.   BP (!) 153/71   Pulse 65   Temp 97.7 F (36.5 C) (Oral)   Ht _0  (1.778 m)   Wt 213 lb 8 oz (96.8 kg)   SpO2 98% Comment: on RA  BMI 30.63 kg/m  Wt Readings from Last 3 Encounters:  02/23/20 213 lb 8 oz (96.8 kg)  10/21/19 219 lb 3.2 oz (99.4 kg)  04/08/19 220 lb (99.8 kg)     Health Maintenance Due  Topic Date Due  . Hepatitis C Screening  Never done  . COVID-19 Vaccine (1) Never done  . COLONOSCOPY  Never done  . INFLUENZA VACCINE  12/06/2019    There are no preventive  care reminders to display for this patient.  Lab Results  Component Value Date   TSH 1.180 01/07/2019   Lab Results  Component Value Date   WBC 9.5 01/07/2019   HGB 15.4 01/07/2019   HCT 45.6 01/07/2019   MCV 90 01/07/2019   PLT 229 01/07/2019   Lab Results  Component Value Date   NA 135 01/07/2019   K 4.5 01/07/2019   CO2 20 01/07/2019  GLUCOSE 90 01/07/2019   BUN 16 01/07/2019   CREATININE 0.78 01/07/2019   BILITOT 0.2 01/07/2019   ALKPHOS 128 (H) 01/07/2019   AST 17 01/07/2019   ALT 21 01/07/2019   PROT 7.0 01/07/2019   ALBUMIN 4.6 01/07/2019   CALCIUM 9.1 01/07/2019   Lab Results  Component Value Date   CHOL 230 (H) 01/07/2019   Lab Results  Component Value Date   HDL 35 (L) 01/07/2019   Lab Results  Component Value Date   LDLCALC 154 (H) 01/07/2019   Lab Results  Component Value Date   TRIG 220 (H) 01/07/2019   Lab Results  Component Value Date   CHOLHDL 5.7 (H) 10/02/2017   Lab Results  Component Value Date   HGBA1C 5.4 01/07/2019      Assessment & Plan:   Problem List Items Addressed This Visit      Cardiovascular and Mediastinum   HTN (hypertension) - Primary (Chronic)    -BP elevated today so will increase amlodipine-valsartan to 5-320 mg. -Advised to monitor BP and pulse at home. -Continue low-sodium diet and stay well-hydrated. -Checking CMP today for medication monitoring. -Will continue to monitor.      Relevant Medications   amLODipine-valsartan (EXFORGE) 5-320 MG tablet   Other Relevant Orders   Comp Met (CMET)   Lipid Profile   CBC w/Diff   TSH   HgB A1c     Other   HLD (hyperlipidemia) (Chronic)    -Last lipid panel elevated -Continue current medication regimen. -Continue with dietary changes and reduce saturated and trans fats. -Stays as active as possible. -Rechecking lipid panel and hepatic function today.      Relevant Medications   amLODipine-valsartan (EXFORGE) 5-320 MG tablet   Other Relevant Orders    Comp Met (CMET)   Lipid Profile   CBC w/Diff   TSH   HgB A1c   Tobacco abuse counseling    Other Visit Diagnoses    Healthcare maintenance       Relevant Orders   Comp Met (CMET)   Lipid Profile   CBC w/Diff   TSH   HgB A1c   Influenza vaccination declined         Tobacco abuse counseling: -Recommend to reduce tobacco use. -Smoking cessation instruction/counseling given:  counseled patient on the dangers of tobacco use, advised patient to stop smoking, and reviewed strategies to maximize success    Meds ordered this encounter  Medications  . amLODipine-valsartan (EXFORGE) 5-320 MG tablet    Sig: Take 1 tablet by mouth daily.    Dispense:  90 tablet    Refill:  1    Order Specific Question:   Supervising Provider    Answer:   Beatrice Lecher D [[id number]]    Follow-up: Return in about 4 months (around 06/25/2020) for HTN- inc med, HLD.   Note:  This note was prepared with assistance of Dragon voice recognition software. Occasional wrong-word or sound-a-like substitutions may have occurred due to the inherent limitations of voice recognition software.  Lorrene Reid, PA-C

## 2020-02-23 NOTE — Assessment & Plan Note (Signed)
-  Last lipid panel elevated -Continue current medication regimen. -Continue with dietary changes and reduce saturated and trans fats. -Stays as active as possible. -Rechecking lipid panel and hepatic function today.

## 2020-02-24 LAB — CBC WITH DIFFERENTIAL/PLATELET
Basophils Absolute: 0.1 10*3/uL (ref 0.0–0.2)
Basos: 1 %
EOS (ABSOLUTE): 0.2 10*3/uL (ref 0.0–0.4)
Eos: 2 %
Hematocrit: 45.4 % (ref 37.5–51.0)
Hemoglobin: 15.7 g/dL (ref 13.0–17.7)
Immature Grans (Abs): 0 10*3/uL (ref 0.0–0.1)
Immature Granulocytes: 0 %
Lymphocytes Absolute: 1.9 10*3/uL (ref 0.7–3.1)
Lymphs: 17 %
MCH: 31.3 pg (ref 26.6–33.0)
MCHC: 34.6 g/dL (ref 31.5–35.7)
MCV: 90 fL (ref 79–97)
Monocytes Absolute: 0.8 10*3/uL (ref 0.1–0.9)
Monocytes: 7 %
Neutrophils Absolute: 8.6 10*3/uL — ABNORMAL HIGH (ref 1.4–7.0)
Neutrophils: 73 %
Platelets: 238 10*3/uL (ref 150–450)
RBC: 5.02 x10E6/uL (ref 4.14–5.80)
RDW: 13.2 % (ref 11.6–15.4)
WBC: 11.6 10*3/uL — ABNORMAL HIGH (ref 3.4–10.8)

## 2020-02-24 LAB — COMPREHENSIVE METABOLIC PANEL
ALT: 18 IU/L (ref 0–44)
AST: 19 IU/L (ref 0–40)
Albumin/Globulin Ratio: 1.8 (ref 1.2–2.2)
Albumin: 4.6 g/dL (ref 3.8–4.9)
Alkaline Phosphatase: 143 IU/L — ABNORMAL HIGH (ref 44–121)
BUN/Creatinine Ratio: 16 (ref 9–20)
BUN: 17 mg/dL (ref 6–24)
Bilirubin Total: 0.4 mg/dL (ref 0.0–1.2)
CO2: 21 mmol/L (ref 20–29)
Calcium: 9.5 mg/dL (ref 8.7–10.2)
Chloride: 101 mmol/L (ref 96–106)
Creatinine, Ser: 1.08 mg/dL (ref 0.76–1.27)
GFR calc Af Amer: 89 mL/min/{1.73_m2} (ref >59)
GFR calc non Af Amer: 77 mL/min/{1.73_m2} (ref >59)
Globulin, Total: 2.6 g/dL (ref 1.5–4.5)
Glucose: 96 mg/dL (ref 65–99)
Potassium: 4.7 mmol/L (ref 3.5–5.2)
Sodium: 139 mmol/L (ref 134–144)
Total Protein: 7.2 g/dL (ref 6.0–8.5)

## 2020-02-24 LAB — LIPID PANEL
Chol/HDL Ratio: 4.2 ratio (ref 0.0–5.0)
Cholesterol, Total: 131 mg/dL (ref 100–199)
HDL: 31 mg/dL — ABNORMAL LOW (ref >39)
LDL Chol Calc (NIH): 73 mg/dL (ref 0–99)
Triglycerides: 155 mg/dL — ABNORMAL HIGH (ref 0–149)
VLDL Cholesterol Cal: 27 mg/dL (ref 5–40)

## 2020-02-24 LAB — TSH: TSH: 0.655 u[IU]/mL (ref 0.450–4.500)

## 2020-02-24 LAB — HEMOGLOBIN A1C
Est. average glucose Bld gHb Est-mCnc: 105 mg/dL
Hgb A1c MFr Bld: 5.3 % (ref 4.8–5.6)

## 2020-03-21 ENCOUNTER — Other Ambulatory Visit: Payer: Self-pay | Admitting: Physician Assistant

## 2020-03-21 DIAGNOSIS — F4323 Adjustment disorder with mixed anxiety and depressed mood: Secondary | ICD-10-CM

## 2020-03-21 DIAGNOSIS — F39 Unspecified mood [affective] disorder: Secondary | ICD-10-CM

## 2020-03-21 DIAGNOSIS — M159 Polyosteoarthritis, unspecified: Secondary | ICD-10-CM

## 2020-04-15 ENCOUNTER — Other Ambulatory Visit: Payer: Self-pay | Admitting: Family Medicine

## 2020-04-15 DIAGNOSIS — E781 Pure hyperglyceridemia: Secondary | ICD-10-CM

## 2020-04-16 ENCOUNTER — Other Ambulatory Visit: Payer: Self-pay | Admitting: Physician Assistant

## 2020-04-27 ENCOUNTER — Other Ambulatory Visit: Payer: Self-pay | Admitting: Physician Assistant

## 2020-04-27 ENCOUNTER — Other Ambulatory Visit: Payer: Self-pay | Admitting: Family Medicine

## 2020-04-27 DIAGNOSIS — F4323 Adjustment disorder with mixed anxiety and depressed mood: Secondary | ICD-10-CM

## 2020-06-03 ENCOUNTER — Other Ambulatory Visit: Payer: Self-pay | Admitting: Family Medicine

## 2020-06-03 DIAGNOSIS — E559 Vitamin D deficiency, unspecified: Secondary | ICD-10-CM

## 2020-06-07 ENCOUNTER — Other Ambulatory Visit: Payer: Self-pay | Admitting: Physician Assistant

## 2020-06-07 DIAGNOSIS — E559 Vitamin D deficiency, unspecified: Secondary | ICD-10-CM

## 2020-06-07 MED ORDER — VITAMIN D (ERGOCALCIFEROL) 1.25 MG (50000 UNIT) PO CAPS: ORAL_CAPSULE | ORAL | 0 refills | Status: DC

## 2020-06-07 NOTE — Telephone Encounter (Signed)
Please contact pt to schedule apt per last AVS for further med refills. Pt needs Vit D labs at his follow up apt for medication refills. AS, CMA

## 2020-06-07 NOTE — Telephone Encounter (Signed)
Left voicemail for patient making him aware. 06-07-20.

## 2020-06-17 ENCOUNTER — Ambulatory Visit (INDEPENDENT_AMBULATORY_CARE_PROVIDER_SITE_OTHER): Payer: BC Managed Care – PPO | Admitting: Physician Assistant

## 2020-06-17 ENCOUNTER — Encounter: Payer: Self-pay | Admitting: Physician Assistant

## 2020-06-17 ENCOUNTER — Other Ambulatory Visit: Payer: Self-pay

## 2020-06-17 VITALS — BP 126/70 | HR 77 | Temp 98.6°F | Ht 70.0 in | Wt 212.1 lb

## 2020-06-17 DIAGNOSIS — E782 Mixed hyperlipidemia: Secondary | ICD-10-CM

## 2020-06-17 DIAGNOSIS — F4323 Adjustment disorder with mixed anxiety and depressed mood: Secondary | ICD-10-CM | POA: Diagnosis not present

## 2020-06-17 DIAGNOSIS — F39 Unspecified mood [affective] disorder: Secondary | ICD-10-CM | POA: Diagnosis not present

## 2020-06-17 DIAGNOSIS — I10 Essential (primary) hypertension: Secondary | ICD-10-CM | POA: Diagnosis not present

## 2020-06-17 MED ORDER — AMLODIPINE BESYLATE-VALSARTAN 10-320 MG PO TABS: 1.0000 | ORAL_TABLET | Freq: Every day | ORAL | 0 refills | Status: DC

## 2020-06-17 MED ORDER — LAMOTRIGINE 100 MG PO TABS: 100.0000 mg | ORAL_TABLET | Freq: Two times a day (BID) | ORAL | 0 refills | Status: DC

## 2020-06-17 NOTE — Assessment & Plan Note (Signed)
-  BP initially elevated, BP recheck improved. -Patient's ambulatory BP readings have been elevated and is symptomatic so will increase amlodipine besylate-losartan to 10-320 mg.  Discussed with patient potential side effect of peripheral edema.  Advised to let me know if unable to tolerate increased dose.  Continue hydrochlorothiazide 12.5 mg. -Continue ambulatory BP monitoring and advised to notify the clinic if blood pressure fails to improve with increased dose. -Recommend stress reduction techniques. -Continue low-sodium diet and good hydration. -Will repeat CMP today.

## 2020-06-17 NOTE — Assessment & Plan Note (Signed)
-  Last lipid panel: total cholesterol 131, triglycerides 155, HDL 31, LDL 73 -On Atorvastatin 80 mg and Niacin 500 mg BID. Discussed with patient to monitor muscle cramps and recommend to continue with proper hydration. If symptoms fail to improve or worsen recommend to consider changing statin to Crestor. Patient verbalized understanding.  -Will collect CMP to evaluate electrolytes and hepatic function. -Follow a diet low in saturated and trans fats. -Will continue to monitor.

## 2020-06-17 NOTE — Patient Instructions (Signed)
High Cholesterol  High cholesterol is a condition in which the blood has high levels of a white, waxy substance similar to fat (cholesterol). The liver makes all the cholesterol that the body needs. The human body needs small amounts of cholesterol to help build cells. A person gets extra or excess cholesterol from the food that he or she eats. The blood carries cholesterol from the liver to the rest of the body. If you have high cholesterol, deposits (plaques) may build up on the walls of your arteries. Arteries are the blood vessels that carry blood away from your heart. These plaques make the arteries narrow and stiff. Cholesterol plaques increase your risk for heart attack and stroke. Work with your health care provider to keep your cholesterol levels in a healthy range. What increases the risk? The following factors may make you more likely to develop this condition:  Eating foods that are high in animal fat (saturated fat) or cholesterol.  Being overweight.  Not getting enough exercise.  A family history of high cholesterol (familial hypercholesterolemia).  Use of tobacco products.  Having diabetes. What are the signs or symptoms? There are no symptoms of this condition. How is this diagnosed? This condition may be diagnosed based on the results of a blood test.  If you are older than 56 years of age, your health care provider may check your cholesterol levels every 4-6 years.  You may be checked more often if you have high cholesterol or other risk factors for heart disease. The blood test for cholesterol measures:  "Bad" cholesterol, or LDL cholesterol. This is the main type of cholesterol that causes heart disease. The desired level is less than 100 mg/dL.  "Good" cholesterol, or HDL cholesterol. HDL helps protect against heart disease by cleaning the arteries and carrying the LDL to the liver for processing. The desired level for HDL is 60 mg/dL or higher.  Triglycerides.  These are fats that your body can store or burn for energy. The desired level is less than 150 mg/dL.  Total cholesterol. This measures the total amount of cholesterol in your blood and includes LDL, HDL, and triglycerides. The desired level is less than 200 mg/dL. How is this treated? This condition may be treated with:  Diet changes. You may be asked to eat foods that have more fiber and less saturated fats or added sugar.  Lifestyle changes. These may include regular exercise, maintaining a healthy weight, and quitting use of tobacco products.  Medicines. These are given when diet and lifestyle changes have not worked. You may be prescribed a statin medicine to help lower your cholesterol levels. Follow these instructions at home: Eating and drinking  Eat a healthy, balanced diet. This diet includes: ? Daily servings of a variety of fresh, frozen, or canned fruits and vegetables. ? Daily servings of whole grain foods that are rich in fiber. ? Foods that are low in saturated fats and trans fats. These include poultry and fish without skin, lean cuts of meat, and low-fat dairy products. ? A variety of fish, especially oily fish that contain omega-3 fatty acids. Aim to eat fish at least 2 times a week.  Avoid foods and drinks that have added sugar.  Use healthy cooking methods, such as roasting, grilling, broiling, baking, poaching, steaming, and stir-frying. Do not fry your food except for stir-frying.   Lifestyle  Get regular exercise. Aim to exercise for a total of 150 minutes a week. Increase your activity level by doing activities   such as gardening, walking, and taking the stairs.  Do not use any products that contain nicotine or tobacco, such as cigarettes, e-cigarettes, and chewing tobacco. If you need help quitting, ask your health care provider.   General instructions  Take over-the-counter and prescription medicines only as told by your health care provider.  Keep all  follow-up visits as told by your health care provider. This is important. Where to find more information  American Heart Association: www.heart.org  National Heart, Lung, and Blood Institute: www.nhlbi.nih.gov Contact a health care provider if:  You have trouble achieving or maintaining a healthy diet or weight.  You are starting an exercise program.  You are unable to stop smoking. Get help right away if:  You have chest pain.  You have trouble breathing.  You have any symptoms of a stroke. "BE FAST" is an easy way to remember the main warning signs of a stroke: ? B - Balance. Signs are dizziness, sudden trouble walking, or loss of balance. ? E - Eyes. Signs are trouble seeing or a sudden change in vision. ? F - Face. Signs are sudden weakness or numbness of the face, or the face or eyelid drooping on one side. ? A - Arms. Signs are weakness or numbness in an arm. This happens suddenly and usually on one side of the body. ? S - Speech. Signs are sudden trouble speaking, slurred speech, or trouble understanding what people say. ? T - Time. Time to call emergency services. Write down what time symptoms started.  You have other signs of a stroke, such as: ? A sudden, severe headache with no known cause. ? Nausea or vomiting. ? Seizure. These symptoms may represent a serious problem that is an emergency. Do not wait to see if the symptoms will go away. Get medical help right away. Call your local emergency services (911 in the [place] Do not drive yourself to the hospital. Summary  Cholesterol plaques increase your risk for heart attack and stroke. Work with your health care provider to keep your cholesterol levels in a healthy range.  Eat a healthy, balanced diet, get regular exercise, and maintain a healthy weight.  Do not use any products that contain nicotine or tobacco, such as cigarettes, e-cigarettes, and chewing tobacco.  Get help right away if you have any symptoms of a  stroke. This information is not intended to replace advice given to you by your health care provider. Make sure you discuss any questions you have with your health care provider. Document Revised: 03/23/2019 Document Reviewed: 03/23/2019 Elsevier Patient Education  2021 Elsevier Inc. Hypertension, Adult Hypertension is another name for high blood pressure. High blood pressure forces your heart to work harder to pump blood. This can cause problems over time. There are two numbers in a blood pressure reading. There is a top number (systolic) over a bottom number (diastolic). It is best to have a blood pressure that is below 120/80. Healthy choices can help lower your blood pressure, or you may need medicine to help lower it. What are the causes? The cause of this condition is not known. Some conditions may be related to high blood pressure. What increases the risk?  Smoking.  Having type 2 diabetes mellitus, high cholesterol, or both.  Not getting enough exercise or physical activity.  Being overweight.  Having too much fat, sugar, calories, or salt (sodium) in your diet.  Drinking too much alcohol.  Having long-term (chronic) kidney disease.  Having a family history   of high blood pressure.  Age. Risk increases with age.  Race. You may be at higher risk if you are African American.  Gender. Men are at higher risk than women before age 45. After age 65, women are at higher risk than men.  Having obstructive sleep apnea.  Stress. What are the signs or symptoms?  High blood pressure may not cause symptoms. Very high blood pressure (hypertensive crisis) may cause: ? Headache. ? Feelings of worry or nervousness (anxiety). ? Shortness of breath. ? Nosebleed. ? A feeling of being sick to your stomach (nausea). ? Throwing up (vomiting). ? Changes in how you see. ? Very bad chest pain. ? Seizures. How is this treated?  This condition is treated by making healthy lifestyle  changes, such as: ? Eating healthy foods. ? Exercising more. ? Drinking less alcohol.  Your health care provider may prescribe medicine if lifestyle changes are not enough to get your blood pressure under control, and if: ? Your top number is above 130. ? Your bottom number is above 80.  Your personal target blood pressure may vary. Follow these instructions at home: Eating and drinking  If told, follow the DASH eating plan. To follow this plan: ? Fill one half of your plate at each meal with fruits and vegetables. ? Fill one fourth of your plate at each meal with whole grains. Whole grains include whole-wheat pasta, brown rice, and whole-grain bread. ? Eat or drink low-fat dairy products, such as skim milk or low-fat yogurt. ? Fill one fourth of your plate at each meal with low-fat (lean) proteins. Low-fat proteins include fish, chicken without skin, eggs, beans, and tofu. ? Avoid fatty meat, cured and processed meat, or chicken with skin. ? Avoid pre-made or processed food.  Eat less than 1,500 mg of salt each day.  Do not drink alcohol if: ? Your doctor tells you not to drink. ? You are pregnant, may be pregnant, or are planning to become pregnant.  If you drink alcohol: ? Limit how much you use to:  0-1 drink a day for women.  0-2 drinks a day for men. ? Be aware of how much alcohol is in your drink. In the [place] one drink equals one 12 oz bottle of beer (355 mL), one 5 oz glass of wine (148 mL), or one 1 oz glass of hard liquor (44 mL).   Lifestyle  Work with your doctor to stay at a healthy weight or to lose weight. Ask your doctor what the best weight is for you.  Get at least 30 minutes of exercise most days of the week. This may include walking, swimming, or biking.  Get at least 30 minutes of exercise that strengthens your muscles (resistance exercise) at least 3 days a week. This may include lifting weights or doing Pilates.  Do not use any products that contain  nicotine or tobacco, such as cigarettes, e-cigarettes, and chewing tobacco. If you need help quitting, ask your doctor.  Check your blood pressure at home as told by your doctor.  Keep all follow-up visits as told by your doctor. This is important.   Medicines  Take over-the-counter and prescription medicines only as told by your doctor. Follow directions carefully.  Do not skip doses of blood pressure medicine. The medicine does not work as well if you skip doses. Skipping doses also puts you at risk for problems.  Ask your doctor about side effects or reactions to medicines that you should watch for.   Contact a doctor if you:  Think you are having a reaction to the medicine you are taking.  Have headaches that keep coming back (recurring).  Feel dizzy.  Have swelling in your ankles.  Have trouble with your vision. Get help right away if you:  Get a very bad headache.  Start to feel mixed up (confused).  Feel weak or numb.  Feel faint.  Have very bad pain in your: ? Chest. ? Belly (abdomen).  Throw up more than once.  Have trouble breathing. Summary  Hypertension is another name for high blood pressure.  High blood pressure forces your heart to work harder to pump blood.  For most people, a normal blood pressure is less than 120/80.  Making healthy choices can help lower blood pressure. If your blood pressure does not get lower with healthy choices, you may need to take medicine. This information is not intended to replace advice given to you by your health care provider. Make sure you discuss any questions you have with your health care provider. Document Revised: 01/01/2018 Document Reviewed: 01/01/2018 Elsevier Patient Education  2021 Elsevier Inc.  

## 2020-06-17 NOTE — Progress Notes (Signed)
Established Patient Office Visit  Subjective:  Patient ID: Aaron Cooper, male    DOB: [birth year]  Age: 55 y.o. MRN: [id number]  CC:  Chief Complaint  Patient presents with  . Hypertension  . Hyperlipidemia    HPI Aaron Cooper presents for follow up on hypertension and hyperlipidemia.  HTN: Pt denies chest pain, palpitations, dizziness or lower extremity swelling. Reports headache and sometimes feels he can hear his heartbeat. Reports he has a new boss at work which has increased his stress. Checks his blood pressure at home which fluctuates. In the evenings usually high, 160s/70-85. In the mornings systolic readings are 140-150. Taking medication as directed without side effects. Reports low sodium diet and good hydration.  HLD: Pt taking medication as directed. Reports the last 4 months has been experiencing muscle cramps of left calf which seems to improve after walking it out.  Mood: Under increased stress with new management at work, which has lead to increased irritability and mood changes. Has been on Zoloft for many years and has noticed decreased libido on and off for the past year. Has not tried anything other medication. Taking Lamictal as directed.  Past Medical History:  Diagnosis Date  . Arthritis   . Depression   . Fatigue 11/09/2015  . HLD (hyperlipidemia) 11/09/2015  . HTN (hypertension) 11/09/2015  . Mood disorder in conditions classified elsewhere 11/09/2015  . Obesity (BMI 30-39.9) 11/09/2015  . Overweight (BMI 25.0-29.9) 11/09/2015  . Vitamin D insufficiency 11/09/2015    Past Surgical History:  Procedure Laterality Date  . BACK SURGERY  2013  . HAND SURGERY Right   . KNEE SURGERY Right 1995    Family History  Problem Relation Age of Onset  . Diabetes Mother   . Hyperlipidemia Mother   . Heart attack Father   . Hyperlipidemia Father   . Hypertension Father   . Cancer Sister        breast  . Stroke Maternal Aunt   . Alcohol abuse Maternal Grandmother    . Heart attack Brother   . Alcohol abuse Brother     Social History   Socioeconomic History  . Marital status: Married    Spouse name: Not on file  . Number of children: Not on file  . Years of education: Not on file  . Highest education level: Not on file  Occupational History  . Not on file  Tobacco Use  . Smoking status: Current Every Day Smoker    Packs/day: 1.50    Years: 25.00    Pack years: 37.50  . Smokeless tobacco: Never Used  . Tobacco comment: Pt refused cessation material  Vaping Use  . Vaping Use: Never used  Substance and Sexual Activity  . Alcohol use: No  . Drug use: No  . Sexual activity: Yes  Other Topics Concern  . Not on file  Social History Narrative  . Not on file   Social Determinants of Health   Financial Resource Strain: Not on file  Food Insecurity: Not on file  Transportation Needs: Not on file  Physical Activity: Not on file  Stress: Not on file  Social Connections: Not on file  Intimate Partner Violence: Not on file    Outpatient Medications Prior to Visit  Medication Sig Dispense Refill  . aspirin 325 MG tablet Take 325 mg by mouth daily.    Marland Kitchen atorvastatin (LIPITOR) 80 MG tablet Take 1 tablet (80 mg total) by mouth daily. TAKE 1 TABLET BY MOUTH  EVERY NIGHT AT BEDTIME**NEEDS APT FOR FURTHER REFILLS** 60 tablet 0  . hydrochlorothiazide (MICROZIDE) 12.5 MG capsule TAKE 1 CAPSULE(12.5 MG) BY MOUTH DAILY 90 capsule 0  . meloxicam (MOBIC) 15 MG tablet TAKE 1 TABLET(15 MG) BY MOUTH DAILY 60 tablet 0  . niacin 500 MG CR capsule Take 1 capsule (500 mg total) by mouth 2 (two) times daily with a meal. 180 capsule 0  . sertraline (ZOLOFT) 100 MG tablet TAKE 1 TABLET(100 MG) BY MOUTH DAILY 90 tablet 0  . Vitamin D, Ergocalciferol, (DRISDOL) 1.25 MG (50000 UNIT) CAPS capsule Take one tablet wkly **NEEDS APT FOR REFILLS** 8 capsule 0  . amLODipine-valsartan (EXFORGE) 5-320 MG tablet Take 1 tablet by mouth daily. 90 tablet 1  . lamoTRIgine  (LAMICTAL) 100 MG tablet TAKE 1/2 TABLET(50 MG) BY MOUTH TWICE DAILY 60 tablet 0   No facility-administered medications prior to visit.    No Known Allergies  ROS Review of Systems A fourteen system review of systems was performed and found to be positive as per HPI.    Objective:    Physical Exam General:  Well Developed, well nourished, in no acute distress, non-toxic appearing Neuro:  Alert and oriented,  extra-ocular muscles intact  HEENT:  Normocephalic, atraumatic, neck supple Skin:  no gross rash, warm, pink. Cardiac:  RRR, S1 S2 wnl's w/o murmur Respiratory:  ECTA B/L, Not using accessory muscles, speaking in full sentences- unlabored. Vascular:  Ext warm, no cyanosis apprec.; cap RF less 2 sec. Psych:  No HI/SI, judgement and insight good, Euthymic mood. Full Affect.  BP 126/70   Pulse 77   Temp 98.6 F (37 C)   Ht 5\' 10"  (1.778 m)   Wt 212 lb 1.6 oz (96.2 kg)   SpO2 96%   BMI 30.43 kg/m  Wt Readings from Last 3 Encounters:  06/17/20 212 lb 1.6 oz (96.2 kg)  02/23/20 213 lb 8 oz (96.8 kg)  10/21/19 219 lb 3.2 oz (99.4 kg)     Health Maintenance Due  Topic Date Due  . Hepatitis C Screening  Never done  . COVID-19 Vaccine (1) Never done  . COLONOSCOPY (Pts 45-25yrs Insurance coverage will need to be confirmed)  Never done  . INFLUENZA VACCINE  12/06/2019    There are no preventive care reminders to display for this patient.  Lab Results  Component Value Date   TSH 0.655 02/23/2020   Lab Results  Component Value Date   WBC 11.6 (H) 02/23/2020   HGB 15.7 02/23/2020   HCT 45.4 02/23/2020   MCV 90 02/23/2020   PLT 238 02/23/2020   Lab Results  Component Value Date   NA 139 02/23/2020   K 4.7 02/23/2020   CO2 21 02/23/2020   GLUCOSE 96 02/23/2020   BUN 17 02/23/2020   CREATININE 1.08 02/23/2020   BILITOT 0.4 02/23/2020   ALKPHOS 143 (H) 02/23/2020   AST 19 02/23/2020   ALT 18 02/23/2020   PROT 7.2 02/23/2020   ALBUMIN 4.6 02/23/2020    CALCIUM 9.5 02/23/2020   Lab Results  Component Value Date   CHOL 131 02/23/2020   Lab Results  Component Value Date   HDL 31 (L) 02/23/2020   Lab Results  Component Value Date   LDLCALC 73 02/23/2020   Lab Results  Component Value Date   TRIG 155 (H) 02/23/2020   Lab Results  Component Value Date   CHOLHDL 4.2 02/23/2020   Lab Results  Component Value Date   HGBA1C 5.3  02/23/2020      Assessment & Plan:   Problem List Items Addressed This Visit      Cardiovascular and Mediastinum   HTN (hypertension) (Chronic)    -BP initially elevated, BP recheck improved. -Patient's ambulatory BP readings have been elevated and is symptomatic so will increase amlodipine besylate-losartan to 10-320 mg.  Discussed with patient potential side effect of peripheral edema.  Advised to let me know if unable to tolerate increased dose.  Continue hydrochlorothiazide 12.5 mg. -Continue ambulatory BP monitoring and advised to notify the clinic if blood pressure fails to improve with increased dose. -Recommend stress reduction techniques. -Continue low-sodium diet and good hydration. -Will repeat CMP today.      Relevant Medications   amLODipine-valsartan (EXFORGE) 10-320 MG tablet   Other Relevant Orders   Comprehensive metabolic panel     Other   Mood disorder (HCC) (Chronic)   Relevant Medications   lamoTRIgine (LAMICTAL) 100 MG tablet   HLD (hyperlipidemia) - Primary (Chronic)    -Last lipid panel: total cholesterol 131, triglycerides 155, HDL 31, LDL 73 -On Atorvastatin 80 mg and Niacin 500 mg BID. Discussed with patient to monitor muscle cramps and recommend to continue with proper hydration. If symptoms fail to improve or worsen recommend to consider changing statin to Crestor. Patient verbalized understanding.  -Will collect CMP to evaluate electrolytes and hepatic function. -Follow a diet low in saturated and trans fats. -Will continue to monitor.      Relevant Medications    amLODipine-valsartan (EXFORGE) 10-320 MG tablet   Other Relevant Orders   Comprehensive metabolic panel   Adjustment disorder with mixed anxiety and depressed mood (Chronic)   Relevant Medications   lamoTRIgine (LAMICTAL) 100 MG tablet     Mood disorder, Adjustment disorder the mixed anxiety and depressed mood: -PHQ-9 score of 5, increased from prior (score of 1).  Denies SI/HI. -Discussed with patient changing sertraline to different SSRI with low risk for sexual dysfunction or another antidepressant such as Wellbutrin. Patient declined changing medication. Advised not appropriate increasing sertraline dose to help improve mood due to risk of exacerbating side effects. Patient is agreeable to increasing Lamictal. Advised to let me know if unable to tolerate increased dose. Will collect CMP to monitor renal function. -Recommend stress reduction techniques. -Will continue to monitor.    Meds ordered this encounter  Medications  . lamoTRIgine (LAMICTAL) 100 MG tablet    Sig: Take 1 tablet (100 mg total) by mouth 2 (two) times daily.    Dispense:  180 tablet    Refill:  0  . amLODipine-valsartan (EXFORGE) 10-320 MG tablet    Sig: Take 1 tablet by mouth daily.    Dispense:  90 tablet    Refill:  0    Follow-up: Return in about 4 months (around 10/15/2020) for HTN, HLD.   Note:  This note was prepared with assistance of Dragon voice recognition software. Occasional wrong-word or sound-a-like substitutions may have occurred due to the inherent limitations of voice recognition software.  Mayer Masker, PA-C

## 2020-06-18 LAB — COMPREHENSIVE METABOLIC PANEL
ALT: 25 IU/L (ref 0–44)
AST: 20 IU/L (ref 0–40)
Albumin/Globulin Ratio: 2.2 (ref 1.2–2.2)
Albumin: 4.9 g/dL (ref 3.8–4.9)
Alkaline Phosphatase: 121 IU/L (ref 44–121)
BUN/Creatinine Ratio: 24 — ABNORMAL HIGH (ref 9–20)
BUN: 28 mg/dL — ABNORMAL HIGH (ref 6–24)
Bilirubin Total: 0.3 mg/dL (ref 0.0–1.2)
CO2: 18 mmol/L — ABNORMAL LOW (ref 20–29)
Calcium: 9.4 mg/dL (ref 8.7–10.2)
Chloride: 103 mmol/L (ref 96–106)
Creatinine, Ser: 1.18 mg/dL (ref 0.76–1.27)
GFR calc Af Amer: 79 mL/min/{1.73_m2} (ref >59)
GFR calc non Af Amer: 69 mL/min/{1.73_m2} (ref >59)
Globulin, Total: 2.2 g/dL (ref 1.5–4.5)
Glucose: 108 mg/dL — ABNORMAL HIGH (ref 65–99)
Potassium: 4.4 mmol/L (ref 3.5–5.2)
Sodium: 138 mmol/L (ref 134–144)
Total Protein: 7.1 g/dL (ref 6.0–8.5)

## 2020-07-17 ENCOUNTER — Other Ambulatory Visit: Payer: Self-pay | Admitting: Physician Assistant

## 2020-08-23 ENCOUNTER — Other Ambulatory Visit: Payer: Self-pay | Admitting: Physician Assistant

## 2020-08-23 DIAGNOSIS — I10 Essential (primary) hypertension: Secondary | ICD-10-CM

## 2020-09-11 ENCOUNTER — Other Ambulatory Visit: Payer: Self-pay | Admitting: Physician Assistant

## 2020-09-11 DIAGNOSIS — M159 Polyosteoarthritis, unspecified: Secondary | ICD-10-CM

## 2020-10-14 ENCOUNTER — Ambulatory Visit: Payer: BC Managed Care – PPO | Admitting: Physician Assistant

## 2020-11-09 ENCOUNTER — Ambulatory Visit: Payer: BC Managed Care – PPO | Admitting: Physician Assistant

## 2020-11-29 ENCOUNTER — Ambulatory Visit (INDEPENDENT_AMBULATORY_CARE_PROVIDER_SITE_OTHER): Payer: BC Managed Care – PPO | Admitting: Physician Assistant

## 2020-11-29 ENCOUNTER — Encounter: Payer: Self-pay | Admitting: Physician Assistant

## 2020-11-29 ENCOUNTER — Other Ambulatory Visit: Payer: Self-pay

## 2020-11-29 VITALS — BP 137/81 | HR 78 | Temp 97.9°F | Ht 71.0 in | Wt 207.2 lb

## 2020-11-29 DIAGNOSIS — F39 Unspecified mood [affective] disorder: Secondary | ICD-10-CM

## 2020-11-29 DIAGNOSIS — I10 Essential (primary) hypertension: Secondary | ICD-10-CM

## 2020-11-29 DIAGNOSIS — E559 Vitamin D deficiency, unspecified: Secondary | ICD-10-CM

## 2020-11-29 DIAGNOSIS — E782 Mixed hyperlipidemia: Secondary | ICD-10-CM

## 2020-11-29 DIAGNOSIS — I83812 Varicose veins of left lower extremities with pain: Secondary | ICD-10-CM

## 2020-11-29 DIAGNOSIS — Z716 Tobacco abuse counseling: Secondary | ICD-10-CM

## 2020-11-29 DIAGNOSIS — R7302 Impaired glucose tolerance (oral): Secondary | ICD-10-CM

## 2020-11-29 NOTE — Assessment & Plan Note (Signed)
-  Improved and stable. -Continue current medication regimen. Will collect CMP for medication monitoring. -Will continue to monitor.

## 2020-11-29 NOTE — Assessment & Plan Note (Signed)
-  PHQ-9 score of 4, stable. -Continue current medication regimen. Will continue to monitor.

## 2020-11-29 NOTE — Patient Instructions (Signed)
Varicose Veins Varicose veins are veins that have become enlarged, bulged, and twisted. They most often appear in the legs. What are the causes? This condition is caused by damage to the valves in the vein. These valves help blood return to your heart. When they are damaged and they stop working properly, blood may flow backward and back up in the veins near the skin, causing the veins to get larger and appear twisted. The condition can result from any issue that causes blood to back up, like pregnancy, prolonged standing, or obesity. What increases the risk? This condition is more likely to develop in people who are: On their feet a lot. Pregnant. Overweight. What are the signs or symptoms? Symptoms of this condition include: Bulging, twisted, and bluish veins. A feeling of heaviness. This may be worse at the end of the day. Leg pain. This may be worse at the end of the day. Swelling in the leg. Changes in skin color over the veins. How is this diagnosed? This condition may be diagnosed based on your symptoms, a physical exam, and an ultrasound test. How is this treated? Treatment for this condition may involve: Avoiding sitting or standing in one position for long periods of time. Wearing compression stockings. These stockings help to prevent blood clots and reduce swelling in the legs. Raising (elevating) the legs when resting. Losing weight. Exercising regularly. If you have persistent symptoms or want to improve the way your varicose veins look, you may choose to have a procedure to close the varicose veins off or to remove them. Treatments to close off the veins include: Sclerotherapy. In this treatment, a solution is injected into a vein to close it off. Laser treatment. In this treatment, the vein is heated with a laser to close it off. Radiofrequency vein ablation. In this treatment, an electrical current produced by radio waves is used to close off the vein. Treatments to  remove the veins include: Phlebectomy. In this treatment, the veins are removed through small incisions made over the veins. Vein ligation and stripping. In this treatment, incisions are made over the veins. The veins are then removed after being tied (ligated) with stitches (sutures). Follow these instructions at home: Activity Walk as much as possible. Walking increases blood flow. This helps blood return to the heart and takes pressure off your veins. It also increases your cardiovascular strength. Follow your health care provider's instructions about exercising. Do not stand or sit in one position for a long period of time. Do not sit with your legs crossed. Rest with your legs raised during the day. General instructions  Follow any diet instructions given to you by your health care provider. Wear compression stockings as directed by your health care provider. Do not wear other kinds of tight clothing around your legs, pelvis, or waist. Elevate your legs at night to above the level of your heart. If you get a cut in the skin over the varicose vein and the vein bleeds: Lie down with your leg raised. Apply firm pressure to the cut with a clean cloth until the bleeding stops. Place a bandage (dressing) on the cut. Contact a health care provider if: The skin around your varicose veins starts to break down. You have pain, redness, tenderness, or hard swelling over a vein. You are uncomfortable because of pain. You get a cut in the skin over a varicose vein and it will not stop bleeding. Summary Varicose veins are veins that have become enlarged, bulged, and   twisted. They most often appear in the legs. This condition is caused by damage to the valves in the vein. These valves help blood return to your heart. Treatment for this condition includes frequent movements, wearing compression stockings, losing weight, and exercising regularly. In some cases, procedures are done to close off or  remove the veins. Treatment for this condition may include wearing compression stockings, elevating the legs, losing weight, and engaging in regular activity. In some cases, procedures are done to close off or remove the veins. This information is not intended to replace advice given to you by your health care provider. Make sure you discuss any questions you have with your health care provider. Document Revised: 09/03/2019 Document Reviewed: 09/03/2019 Elsevier Patient Education  2022 [employer]  

## 2020-11-29 NOTE — Assessment & Plan Note (Signed)
-  Last lipid panel: total cholesterol 131, triglycerides 155, HDL 31, LDL 73 -Continue current medication regimen. Will repeat lipid panel and hepatic function today. -Recommend increasing physical activity as tolerated. -Will continue to monitor.

## 2020-11-29 NOTE — Assessment & Plan Note (Signed)
-  Last A1c 5.3, will repeat A1c today.

## 2020-11-29 NOTE — Assessment & Plan Note (Signed)
-  Last Vitamin D 45.4 (01/07/2019), will repeat today. -Pending lab results will send additional refills of Vitamin D 50,000 units or adjust treatment plan.

## 2020-11-29 NOTE — Progress Notes (Signed)
Established Patient Office Visit  Subjective:  Patient ID: Aaron Cooper, male    DOB: 03-Mar-1966  Age: 55 y.o. MRN: [id number]  CC:  Chief Complaint  Patient presents with   Follow-up   Hypertension    HPI Aaron Cooper presents for follow upon hypertension, hyperlipidemia and mood. Patient has c/o left lower varicose veins which are painful. Patient reports has tried compression socks which he did not notice a difference with symptoms. Sometimes will have swelling. Has been taking Tylenol to help with the pain.  HTN: Pt denies chest pain, palpitations, dizziness or headache. Taking medication as directed without side effects. Checks BP at home and readings range 130/80s. Pt reports noticed his blood pressure slowly coming down after medication was increased.  HLD: Pt taking medication as directed without issues. Patient continues to limit fried foods. Does not have a regular exercise routine due to working long hours.  Mood: Denies mood changes or fluctuations. Reports medication compliance.   Tobacco use: Reports continues to smoke about 1 PPD. Not ready to quit.  Past Medical History:  Diagnosis Date   Arthritis    Depression    Fatigue 11/09/2015   HLD (hyperlipidemia) 11/09/2015   HTN (hypertension) 11/09/2015   Mood disorder in conditions classified elsewhere 11/09/2015   Obesity (BMI 30-39.9) 11/09/2015   Overweight (BMI 25.0-29.9) 11/09/2015   Vitamin D insufficiency 11/09/2015    Past Surgical History:  Procedure Laterality Date   BACK SURGERY  2013   HAND SURGERY Right    KNEE SURGERY Right 1995    Family History  Problem Relation Age of Onset   Diabetes Mother    Hyperlipidemia Mother    Heart attack Father    Hyperlipidemia Father    Hypertension Father    Cancer Sister        breast   Stroke Maternal Aunt    Alcohol abuse Maternal Grandmother    Heart attack Brother    Alcohol abuse Brother     Social History   Socioeconomic History   Marital  status: Married    Spouse name: Not on file   Number of children: Not on file   Years of education: Not on file   Highest education level: Not on file  Occupational History   Not on file  Tobacco Use   Smoking status: Every Day    Packs/day: 1.50    Years: 25.00    Pack years: 37.50    Types: Cigarettes   Smokeless tobacco: Never   Tobacco comments:    Pt refused cessation material  Vaping Use   Vaping Use: Never used  Substance and Sexual Activity   Alcohol use: No   Drug use: No   Sexual activity: Yes  Other Topics Concern   Not on file  Social History Narrative   Not on file   Social Determinants of Health   Financial Resource Strain: Not on file  Food Insecurity: Not on file  Transportation Needs: Not on file  Physical Activity: Not on file  Stress: Not on file  Social Connections: Not on file  Intimate Partner Violence: Not on file    Outpatient Medications Prior to Visit  Medication Sig Dispense Refill   amLODipine-valsartan (EXFORGE) 10-320 MG tablet Take 1 tablet by mouth daily. 90 tablet 0   aspirin 325 MG tablet Take 325 mg by mouth daily.     atorvastatin (LIPITOR) 80 MG tablet Take 1 tablet (80 mg total) by mouth daily. TAKE 1  TABLET BY MOUTH EVERY NIGHT AT BEDTIME**NEEDS APT FOR FURTHER REFILLS** 60 tablet 0   hydrochlorothiazide (MICROZIDE) 12.5 MG capsule Take 1 capsule (12.5 mg total) by mouth daily. 90 capsule 0   lamoTRIgine (LAMICTAL) 100 MG tablet Take 1 tablet (100 mg total) by mouth 2 (two) times daily. 180 tablet 0   meloxicam (MOBIC) 15 MG tablet TAKE 1 TABLET(15 MG) BY MOUTH DAILY 60 tablet 0   niacin 500 MG CR capsule Take 1 capsule (500 mg total) by mouth 2 (two) times daily with a meal. 180 capsule 0   sertraline (ZOLOFT) 100 MG tablet TAKE 1 TABLET(100 MG) BY MOUTH DAILY 90 tablet 0   Vitamin D, Ergocalciferol, (DRISDOL) 1.25 MG (50000 UNIT) CAPS capsule Take one tablet wkly **NEEDS APT FOR REFILLS** 8 capsule 0   No facility-administered  medications prior to visit.    No Known Allergies  ROS Review of Systems A fourteen system review of systems was performed and found to be positive as per HPI.   Objective:    Physical Exam General:  Well Developed, well nourished, appropriate for stated age.  Neuro:  Alert and oriented,  extra-ocular muscles intact  HEENT:  Normocephalic, atraumatic, neck supple Skin:  no gross rash, warm, pink. Cardiac:  RRR, S1 S2, no murmur  Respiratory:  ECTA B/L and A/P, Not using accessory muscles, speaking in full sentences- unlabored. Vascular:  Ext warm, no cyanosis apprec.; varicosities of left lower extremity noted Psych:  No HI/SI, judgement and insight good, Euthymic mood. Full Affect.  BP 137/81   Pulse 78   Temp 97.9 F (36.6 C)   Ht _0  (1.803 m)   Wt 207 lb 3.2 oz (94 kg)   SpO2 97%   BMI 28.90 kg/m  Wt Readings from Last 3 Encounters:  11/29/20 207 lb 3.2 oz (94 kg)  06/17/20 212 lb 1.6 oz (96.2 kg)  02/23/20 213 lb 8 oz (96.8 kg)     Health Maintenance Due  Topic Date Due   COVID-19 Vaccine (1) Never done   Pneumococcal Vaccine 25-77 Years old (1 - PCV) Never done   Hepatitis C Screening  Never done   Zoster Vaccines- Shingrix (1 of 2) Never done   COLONOSCOPY (Pts 45-2yr Insurance coverage will need to be confirmed)  Never done    There are no preventive care reminders to display for this patient.  Lab Results  Component Value Date   TSH 0.655 02/23/2020   Lab Results  Component Value Date   WBC 11.6 (H) 02/23/2020   HGB 15.7 02/23/2020   HCT 45.4 02/23/2020   MCV 90 02/23/2020   PLT 238 02/23/2020   Lab Results  Component Value Date   NA 138 06/17/2020   K 4.4 06/17/2020   CO2 18 (L) 06/17/2020   GLUCOSE 108 (H) 06/17/2020   BUN 28 (H) 06/17/2020   CREATININE 1.18 06/17/2020   BILITOT 0.3 06/17/2020   ALKPHOS 121 06/17/2020   AST 20 06/17/2020   ALT 25 06/17/2020   PROT 7.1 06/17/2020   ALBUMIN 4.9 06/17/2020   CALCIUM 9.4 06/17/2020    Lab Results  Component Value Date   CHOL 131 02/23/2020   Lab Results  Component Value Date   HDL 31 (L) 02/23/2020   Lab Results  Component Value Date   LDLCALC 73 02/23/2020   Lab Results  Component Value Date   TRIG 155 (H) 02/23/2020   Lab Results  Component Value Date   CHOLHDL 4.2 02/23/2020  Lab Results  Component Value Date   HGBA1C 5.3 02/23/2020      Assessment & Plan:   Problem List Items Addressed This Visit       Cardiovascular and Mediastinum   HTN (hypertension) - Primary (Chronic)    -Improved and stable. -Continue current medication regimen. Will collect CMP for medication monitoring. -Will continue to monitor.       Relevant Orders   Comp Met (CMET)   CBC w/Diff     Endocrine   Glucose intolerance (impaired glucose tolerance)    -Last A1c 5.3, will repeat A1c today.       Relevant Orders   HgB A1c     Other   Mood disorder (HCC) (Chronic)    -PHQ-9 score of 4, stable. -Continue current medication regimen. Will continue to monitor.        HLD (hyperlipidemia) (Chronic)    -Last lipid panel: total cholesterol 131, triglycerides 155, HDL 31, LDL 73 -Continue current medication regimen. Will repeat lipid panel and hepatic function today. -Recommend increasing physical activity as tolerated. -Will continue to monitor.       Relevant Orders   Lipid Profile   CBC w/Diff   Vitamin D deficiency    -Last Vitamin D 45.4 (01/07/2019), will repeat today. -Pending lab results will send additional refills of Vitamin D 50,000 units or adjust treatment plan.       Relevant Orders   Vitamin D (25 hydroxy)   Other Visit Diagnoses     Encounter for tobacco use cessation counseling       Varicose veins of left lower extremity with pain       Relevant Orders   Ambulatory referral to Vascular Surgery      Varicose veins of left lower extremity with pain: -Will place referral to Vascular. Recommend to use knee-high compressions  and elevation.  Encounter for tobacco use cessation counseling: -Smoking cessation instruction/counseling given:  counseled patient on the dangers of tobacco use, advised patient to stop smoking, and reviewed strategies to maximize success -Encourage to gradually reduce tobacco use.   No orders of the defined types were placed in this encounter.   Follow-up: Return in about 4 months (around 04/01/2021) for HTN, HLD, mood.   Note:  This note was prepared with assistance of Dragon voice recognition software. Occasional wrong-word or sound-a-like substitutions may have occurred due to the inherent limitations of voice recognition software.  Lorrene Reid, PA-C

## 2020-11-30 LAB — LIPID PANEL
Chol/HDL Ratio: 8.3 ratio — ABNORMAL HIGH (ref 0.0–5.0)
Cholesterol, Total: 215 mg/dL — ABNORMAL HIGH (ref 100–199)
HDL: 26 mg/dL — ABNORMAL LOW (ref >39)
LDL Chol Calc (NIH): 135 mg/dL — ABNORMAL HIGH (ref 0–99)
Triglycerides: 296 mg/dL — ABNORMAL HIGH (ref 0–149)
VLDL Cholesterol Cal: 54 mg/dL — ABNORMAL HIGH (ref 5–40)

## 2020-11-30 LAB — COMPREHENSIVE METABOLIC PANEL
ALT: 25 IU/L (ref 0–44)
AST: 22 IU/L (ref 0–40)
Albumin/Globulin Ratio: 1.7 (ref 1.2–2.2)
Albumin: 4.5 g/dL (ref 3.8–4.9)
Alkaline Phosphatase: 151 IU/L — ABNORMAL HIGH (ref 44–121)
BUN/Creatinine Ratio: 24 — ABNORMAL HIGH (ref 9–20)
BUN: 21 mg/dL (ref 6–24)
Bilirubin Total: 0.2 mg/dL (ref 0.0–1.2)
CO2: 20 mmol/L (ref 20–29)
Calcium: 9.2 mg/dL (ref 8.7–10.2)
Chloride: 105 mmol/L (ref 96–106)
Creatinine, Ser: 0.87 mg/dL (ref 0.76–1.27)
Globulin, Total: 2.7 g/dL (ref 1.5–4.5)
Glucose: 86 mg/dL (ref 65–99)
Potassium: 4.8 mmol/L (ref 3.5–5.2)
Sodium: 140 mmol/L (ref 134–144)
Total Protein: 7.2 g/dL (ref 6.0–8.5)
eGFR: 101 mL/min/{1.73_m2} (ref >59)

## 2020-11-30 LAB — CBC WITH DIFFERENTIAL/PLATELET
Basophils Absolute: 0.1 10*3/uL (ref 0.0–0.2)
Basos: 1 %
EOS (ABSOLUTE): 0.2 10*3/uL (ref 0.0–0.4)
Eos: 2 %
Hematocrit: 40.4 % (ref 37.5–51.0)
Hemoglobin: 13.7 g/dL (ref 13.0–17.7)
Immature Grans (Abs): 0 10*3/uL (ref 0.0–0.1)
Immature Granulocytes: 0 %
Lymphocytes Absolute: 1.4 10*3/uL (ref 0.7–3.1)
Lymphs: 14 %
MCH: 31 pg (ref 26.6–33.0)
MCHC: 33.9 g/dL (ref 31.5–35.7)
MCV: 91 fL (ref 79–97)
Monocytes Absolute: 0.7 10*3/uL (ref 0.1–0.9)
Monocytes: 7 %
Neutrophils Absolute: 8 10*3/uL — ABNORMAL HIGH (ref 1.4–7.0)
Neutrophils: 76 %
Platelets: 282 10*3/uL (ref 150–450)
RBC: 4.42 x10E6/uL (ref 4.14–5.80)
RDW: 13.4 % (ref 11.6–15.4)
WBC: 10.5 10*3/uL (ref 3.4–10.8)

## 2020-11-30 LAB — HEMOGLOBIN A1C
Est. average glucose Bld gHb Est-mCnc: 114 mg/dL
Hgb A1c MFr Bld: 5.6 % (ref 4.8–5.6)

## 2020-11-30 LAB — VITAMIN D 25 HYDROXY (VIT D DEFICIENCY, FRACTURES): Vit D, 25-Hydroxy: 42.4 ng/mL (ref 30.0–100.0)

## 2021-01-05 ENCOUNTER — Other Ambulatory Visit: Payer: Self-pay

## 2021-01-05 DIAGNOSIS — I8393 Asymptomatic varicose veins of bilateral lower extremities: Secondary | ICD-10-CM

## 2021-01-13 ENCOUNTER — Ambulatory Visit: Payer: BC Managed Care – PPO | Admitting: Physician Assistant

## 2021-01-13 ENCOUNTER — Other Ambulatory Visit: Payer: Self-pay

## 2021-01-13 ENCOUNTER — Ambulatory Visit (HOSPITAL_COMMUNITY)
Admission: RE | Admit: 2021-01-13 | Discharge: 2021-01-13 | Disposition: A | Discharge status: Home / Self Care | Payer: BC Managed Care – PPO | Source: Ambulatory Visit | Attending: Vascular Surgery | Admitting: Vascular Surgery

## 2021-01-13 VITALS — BP 127/76 | HR 75 | Temp 98.3°F | Resp 18 | Ht 71.0 in | Wt 204.0 lb

## 2021-01-13 DIAGNOSIS — I8002 Phlebitis and thrombophlebitis of superficial vessels of left lower extremity: Secondary | ICD-10-CM | POA: Diagnosis not present

## 2021-01-13 DIAGNOSIS — I8393 Asymptomatic varicose veins of bilateral lower extremities: Secondary | ICD-10-CM | POA: Diagnosis present
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
## 2021-01-26 ENCOUNTER — Encounter: Payer: Self-pay | Admitting: Physician Assistant

## 2021-01-26 ENCOUNTER — Other Ambulatory Visit: Payer: Self-pay | Admitting: Physician Assistant

## 2021-01-26 DIAGNOSIS — F39 Unspecified mood [affective] disorder: Secondary | ICD-10-CM

## 2021-01-26 DIAGNOSIS — F4323 Adjustment disorder with mixed anxiety and depressed mood: Secondary | ICD-10-CM

## 2021-01-26 DIAGNOSIS — M159 Polyosteoarthritis, unspecified: Secondary | ICD-10-CM

## 2021-01-26 DIAGNOSIS — I10 Essential (primary) hypertension: Secondary | ICD-10-CM

## 2021-01-27 ENCOUNTER — Other Ambulatory Visit: Payer: Self-pay | Admitting: Physician Assistant

## 2021-01-27 DIAGNOSIS — N529 Male erectile dysfunction, unspecified: Secondary | ICD-10-CM

## 2021-01-27 MED ORDER — SILDENAFIL CITRATE 25 MG PO TABS: 25.0000 mg | ORAL_TABLET | Freq: Every day | ORAL | 0 refills | Status: DC | PRN

## 2021-04-14 ENCOUNTER — Encounter: Payer: Self-pay | Admitting: Physician Assistant

## 2021-04-14 ENCOUNTER — Ambulatory Visit (INDEPENDENT_AMBULATORY_CARE_PROVIDER_SITE_OTHER): Payer: BC Managed Care – PPO | Admitting: Physician Assistant

## 2021-04-14 ENCOUNTER — Other Ambulatory Visit: Payer: Self-pay

## 2021-04-14 VITALS — BP 119/66 | HR 76 | Temp 98.2°F | Ht 71.0 in | Wt 208.5 lb

## 2021-04-14 DIAGNOSIS — R6883 Chills (without fever): Secondary | ICD-10-CM

## 2021-04-14 DIAGNOSIS — R051 Acute cough: Secondary | ICD-10-CM

## 2021-04-14 DIAGNOSIS — J0140 Acute pansinusitis, unspecified: Secondary | ICD-10-CM

## 2021-04-14 DIAGNOSIS — N529 Male erectile dysfunction, unspecified: Secondary | ICD-10-CM | POA: Diagnosis not present

## 2021-04-14 DIAGNOSIS — F39 Unspecified mood [affective] disorder: Secondary | ICD-10-CM

## 2021-04-14 DIAGNOSIS — I10 Essential (primary) hypertension: Secondary | ICD-10-CM | POA: Diagnosis not present

## 2021-04-14 DIAGNOSIS — E782 Mixed hyperlipidemia: Secondary | ICD-10-CM | POA: Diagnosis not present

## 2021-04-14 LAB — POCT INFLUENZA A/B
Influenza A, POC: NEGATIVE
Influenza B, POC: NEGATIVE

## 2021-04-14 MED ORDER — AZITHROMYCIN 250 MG PO TABS: ORAL_TABLET | ORAL | 0 refills | Status: AC

## 2021-04-14 NOTE — Patient Instructions (Signed)
Erectile Dysfunction °Erectile dysfunction (ED) is the inability to get or keep an erection in order to have sexual intercourse. ED is considered a symptom of an underlying disorder and is not considered a disease. ED may include: °Inability to get an erection. °Lack of enough hardness of the erection to allow penetration. °Loss of erection before sex is finished. °What are the causes? °This condition may be caused by: °Physical causes, such as: °Artery problems. This may include heart disease, high blood pressure, atherosclerosis, and diabetes. °Hormonal problems, such as low testosterone. °Obesity. °Nerve problems. This may include back or pelvic injuries, multiple sclerosis, Parkinson's disease, spinal cord injury, and stroke. °Certain medicines, such as: °Pain relievers. °Antidepressants. °Blood pressure medicines and water pills (diuretics). °Cancer medicines. °Antihistamines. °Muscle relaxants. °Lifestyle factors, such as: °Use of drugs such as marijuana, cocaine, or opioids. °Excessive use of alcohol. °Smoking. °Lack of physical activity or exercise. °Psychological causes, such as: °Anxiety or stress. °Sadness or depression. °Exhaustion. °Fear about sexual performance. °Guilt. °What are the signs or symptoms? °Symptoms of this condition include: °Inability to get an erection. °Lack of enough hardness of the erection to allow penetration. °Loss of the erection before sex is finished. °Sometimes having normal erections, but with frequent unsatisfactory episodes. °Low sexual satisfaction in either partner due to erection problems. °A curved penis occurring with erection. The curve may cause pain, or the penis may be too curved to allow for intercourse. °Never having nighttime or morning erections. °How is this diagnosed? °This condition is often diagnosed by: °Performing a physical exam to find other diseases or specific problems with the penis. °Asking you detailed questions about the problem. °Doing tests,  such as: °Blood tests to check for diabetes mellitus or high cholesterol, or to measure hormone levels. °Other tests to check for underlying health conditions. °An ultrasound exam to check for scarring. °A test to check blood flow to the penis. °Doing a sleep study at home to measure nighttime erections. °How is this treated? °This condition may be treated by: °Medicines, such as: °Medicine taken by mouth to help you achieve an erection (oral medicine). °Hormone replacement therapy to replace low testosterone levels. °Medicine that is injected into the penis. Your health care provider may instruct you how to give yourself these injections at home. °Medicine that is delivered with a short applicator tube. The tube is inserted into the opening at the tip of the penis, which is the opening of the urethra. A tiny pellet of medicine is put in the urethra. The pellet dissolves and enhances erectile function. This is also called MUSE (medicated urethral system for erections) therapy. °Vacuum pump. This is a pump with a ring on it. The pump and ring are placed on the penis and used to create pressure that helps the penis become erect. °Penile implant surgery. In this procedure, you may receive: °An inflatable implant. This consists of cylinders, a pump, and a reservoir. The cylinders can be inflated with a fluid that helps to create an erection, and they can be deflated after intercourse. °A semi-rigid implant. This consists of two silicone rubber rods. The rods provide some rigidity. They are also flexible, so the penis can both curve downward in its normal position and become straight for sexual intercourse. °Blood vessel surgery to improve blood flow to the penis. During this procedure, a blood vessel from a different part of the body is placed into the penis to allow blood to flow around (bypass) damaged or blocked blood vessels. °Lifestyle changes,   such as exercising more, losing weight, and quitting smoking. °Follow  these instructions at home: °Medicines ° °Take over-the-counter and prescription medicines only as told by your health care provider. Do not increase the dosage without first discussing it with your health care provider. °If you are using self-injections, do injections as directed by your health care provider. Make sure you avoid any veins that are on the surface of the penis. After giving an injection, apply pressure to the injection site for 5 minutes. °Talk to your health care provider about how to prevent headaches while taking ED medicines. These medicines may cause a sudden headache due to the increase in blood flow in your body. °General instructions °Exercise regularly, as directed by your health care provider. Work with your health care provider to lose weight, if needed. °Do not use any products that contain nicotine or tobacco. These products include cigarettes, chewing tobacco, and vaping devices, such as e-cigarettes. If you need help quitting, ask your health care provider. °Before using a vacuum pump, read the instructions that come with the pump and discuss any questions with your health care provider. °Keep all follow-up visits. This is important. °Contact a health care provider if: °You feel nauseous. °You are vomiting. °You get sudden headaches while taking ED medicines. °You have any concerns about your sexual health. °Get help right away if: °You are taking oral or injectable medicines and you have an erection that lasts longer than 4 hours. If your health care provider is unavailable, go to the nearest emergency room for evaluation. An erection that lasts much longer than 4 hours can result in permanent damage to your penis. °You have severe pain in your groin or abdomen. °You develop redness or severe swelling of your penis. °You have redness spreading at your groin or lower abdomen. °You are unable to urinate. °You experience chest pain or a rapid heartbeat (palpitations) after taking oral  medicines. °These symptoms may represent a serious problem that is an emergency. Do not wait to see if the symptoms will go away. Get medical help right away. Call your local emergency services (911 in the [place] Do not drive yourself to the hospital. °Summary °Erectile dysfunction (ED) is the inability to get or keep an erection during sexual intercourse. °This condition is diagnosed based on a physical exam, your symptoms, and tests to determine the cause. Treatment varies depending on the cause and may include medicines, hormone therapy, surgery, or a vacuum pump. °You may need follow-up visits to make sure that you are using your medicines or devices correctly. °Get help right away if you are taking or injecting medicines and you have an erection that lasts longer than 4 hours. °This information is not intended to replace advice given to you by your health care provider. Make sure you discuss any questions you have with your health care provider. °Document Revised: 07/20/2020 Document Reviewed: 07/20/2020 °Elsevier Patient Education © 2022 [employer] ° °

## 2021-04-14 NOTE — Progress Notes (Signed)
Established Patient Office Visit  Subjective:  Patient ID: Aaron Cooper, male    DOB: 05/07/1966  Age: 55 y.o. MRN: [id number]  CC:  Chief Complaint  Patient presents with   Follow-up   Hypertension    HPI Aaron Cooper presents for follow up on hypertension, hyperlipidemia and mood. Patient has c/o nasal congestion, cough, chills, frontal headache, sinus pressure, chest congestion with yellow sputum, fatigue, and diarrhea x 1.5 weeks. Did have wheezing but not today. Shortness of breath with activity. Denies fever, earache or altered mental status. Has tried Mucinex, Zicam and theraflu with minimal relief. States has been exposed at work by sick co-workers.    HTN: Pt denies chest pain, palpitations, dizziness or lower extremity swelling. Taking medication as directed without side effects. Does check BP at home occasionally.  HLD: Pt taking medication as directed without issues. Denies side effects including myalgias or muscle weakness.  His wife does most of the cooking which does not involve fried foods or red meat often.  Mood: States mood has been consistently stable. Denies mood changes, SI/HI. Reports situational irritability with current illness.   ED: Reports has tolerated medication without issues. Denies chest pain, palpitations, elevated blood pressure or heart rate.   Past Medical History:  Diagnosis Date   Arthritis    Depression    Fatigue 11/09/2015   HLD (hyperlipidemia) 11/09/2015   HTN (hypertension) 11/09/2015   Mood disorder in conditions classified elsewhere 11/09/2015   Obesity (BMI 30-39.9) 11/09/2015   Overweight (BMI 25.0-29.9) 11/09/2015   Vitamin D insufficiency 11/09/2015    Past Surgical History:  Procedure Laterality Date   BACK SURGERY  2013   HAND SURGERY Right    KNEE SURGERY Right 1995    Family History  Problem Relation Age of Onset   Diabetes Mother    Hyperlipidemia Mother    Heart attack Father    Hyperlipidemia Father    Hypertension  Father    Cancer Sister        breast   Stroke Maternal Aunt    Alcohol abuse Maternal Grandmother    Heart attack Brother    Alcohol abuse Brother     Social History   Socioeconomic History   Marital status: Married    Spouse name: Not on file   Number of children: Not on file   Years of education: Not on file   Highest education level: Not on file  Occupational History   Not on file  Tobacco Use   Smoking status: Every Day    Packs/day: 1.50    Years: 25.00    Pack years: 37.50    Types: Cigarettes   Smokeless tobacco: Never   Tobacco comments:    Pt refused cessation material  Vaping Use   Vaping Use: Never used  Substance and Sexual Activity   Alcohol use: No   Drug use: No   Sexual activity: Yes  Other Topics Concern   Not on file  Social History Narrative   Not on file   Social Determinants of Health   Financial Resource Strain: Not on file  Food Insecurity: Not on file  Transportation Needs: Not on file  Physical Activity: Not on file  Stress: Not on file  Social Connections: Not on file  Intimate Partner Violence: Not on file    Outpatient Medications Prior to Visit  Medication Sig Dispense Refill   amLODipine-valsartan (EXFORGE) 10-320 MG tablet TAKE 1 TABLET BY MOUTH DAILY 90 tablet 0  aspirin 325 MG tablet Take 325 mg by mouth daily.     atorvastatin (LIPITOR) 80 MG tablet Take 1 tablet (80 mg total) by mouth daily. TAKE 1 TABLET BY MOUTH EVERY NIGHT AT BEDTIME**NEEDS APT FOR FURTHER REFILLS** 60 tablet 0   hydrochlorothiazide (MICROZIDE) 12.5 MG capsule Take 1 capsule (12.5 mg total) by mouth daily. 90 capsule 0   lamoTRIgine (LAMICTAL) 100 MG tablet TAKE 1/2 TABLET(50 MG) BY MOUTH TWICE DAILY 60 tablet 1   meloxicam (MOBIC) 15 MG tablet TAKE 1 TABLET(15 MG) BY MOUTH DAILY 60 tablet 0   niacin 500 MG CR capsule Take 1 capsule (500 mg total) by mouth 2 (two) times daily with a meal. 180 capsule 0   sertraline (ZOLOFT) 100 MG tablet TAKE 1  TABLET(100 MG) BY MOUTH DAILY 90 tablet 0   sildenafil (VIAGRA) 25 MG tablet Take 1 tablet (25 mg total) by mouth daily as needed for erectile dysfunction. 10 tablet 0   Vitamin D, Ergocalciferol, (DRISDOL) 1.25 MG (50000 UNIT) CAPS capsule Take one tablet wkly **NEEDS APT FOR REFILLS** 8 capsule 0   No facility-administered medications prior to visit.    No Known Allergies  ROS Review of Systems Review of Systems:  A fourteen system review of systems was performed and found to be positive as per HPI.   Objective:    Physical Exam General:  Well Developed, well nourished, appropriate for stated age.  Neuro:  Alert and oriented,  extra-ocular muscles intact  HEENT:  Normocephalic, atraumatic, tenderness of frontal and maxillary sinus, PERRL, pinguecula of both eyes noted, normal TM's of both ears with some fluid of right TM, negative Tragus and Pinna sign's bilaterally, boggy turbinates, mild erythema of posterior oropharynx w/o exudates, neck supple, + cervical adenopathy (left submandibular gland) Skin:  no gross rash, warm, pink. Cardiac:  RRR, S1 S2 Respiratory: Wheezing and scattered rhonchi noted no crackles or rales, Not using accessory muscles, speaking in full sentences- unlabored. Vascular:  Ext warm, no cyanosis apprec.; cap RF less 2 sec. Psych:  No HI/SI, judgement and insight good, Euthymic mood. Full Affect.  BP 119/66   Pulse 76   Temp 98.2 F (36.8 C)   Ht 5' 11"  (1.803 m)   Wt 208 lb 8 oz (94.6 kg)   SpO2 98%   BMI 29.08 kg/m  Wt Readings from Last 3 Encounters:  04/14/21 208 lb 8 oz (94.6 kg)  01/13/21 204 lb (92.5 kg)  11/29/20 207 lb 3.2 oz (94 kg)     Health Maintenance Due  Topic Date Due   Hepatitis C Screening  Never done    There are no preventive care reminders to display for this patient.  Lab Results  Component Value Date   TSH 0.655 02/23/2020   Lab Results  Component Value Date   WBC 9.9 04/14/2021   HGB 13.0 04/14/2021   HCT 38.9  04/14/2021   MCV 89 04/14/2021   PLT 336 04/14/2021   Lab Results  Component Value Date   NA 138 04/14/2021   K 4.7 04/14/2021   CO2 23 04/14/2021   GLUCOSE 96 04/14/2021   BUN 14 04/14/2021   CREATININE 0.93 04/14/2021   BILITOT 0.3 04/14/2021   ALKPHOS 155 (H) 04/14/2021   AST 23 04/14/2021   ALT 27 04/14/2021   PROT 7.0 04/14/2021   ALBUMIN 4.3 04/14/2021   CALCIUM 9.2 04/14/2021   EGFR 96 04/14/2021   Lab Results  Component Value Date   CHOL 215 (H)  11/29/2020   Lab Results  Component Value Date   HDL 26 (L) 11/29/2020   Lab Results  Component Value Date   LDLCALC 135 (H) 11/29/2020   Lab Results  Component Value Date   TRIG 296 (H) 11/29/2020   Lab Results  Component Value Date   CHOLHDL 8.3 (H) 11/29/2020   Lab Results  Component Value Date   HGBA1C 5.6 11/29/2020   Depression screen [facility] 2/9 04/14/2021 11/29/2020 06/17/2020 02/23/2020 10/21/2019  Decreased Interest 1 1 2  0 1  Down, Depressed, Hopeless 0 0 2 0 1  PHQ - 2 Score 1 1 4  0 2  Altered sleeping 1 0 0 0 0  Tired, decreased energy 1 0 1 1 1   Change in appetite 0 0 0 0 0  Feeling bad or failure about yourself  0 1 0 0 0  Trouble concentrating 0 1 0 0 0  Moving slowly or fidgety/restless 0 1 0 0 0  Suicidal thoughts 0 0 0 0 0  PHQ-9 Score 3 4 5 1 3   Difficult doing work/chores - - - Not difficult at all -  Some recent data might be hidden   GAD 7 : Generalized Anxiety Score 04/14/2021 11/29/2020 09/25/2018 06/20/2018  Nervous, Anxious, on Edge 0 0 0 2  Control/stop worrying 0 1 0 -  Worry too much - different things 0 0 0 2  Trouble relaxing 0 1 0 3  Restless 0 1 0 1  Easily annoyed or irritable 0 0 1 3  Afraid - awful might happen 0 0 0 2  Total GAD 7 Score 0 3 1 -  Anxiety Difficulty - Somewhat difficult Not difficult at all Very difficult       Assessment & Plan:   Problem List Items Addressed This Visit       Cardiovascular and Mediastinum   HTN (hypertension) - Primary (Chronic)    Relevant Orders   CBC w/Diff (Completed)   Comp Met (CMET) (Completed)     Other   Mood disorder (HCC) (Chronic)   HLD (hyperlipidemia) (Chronic)   Other Visit Diagnoses     Erectile dysfunction, unspecified erectile dysfunction type       Acute non-recurrent pansinusitis       Relevant Medications   azithromycin (ZITHROMAX) 250 MG tablet   Acute cough       Relevant Orders   POCT Influenza A/B (Completed)      HTN: -BP at goal. -Continue current medication regimen. -Will collect CMP for medication monitoring. -Weill continue to monitor.  HLD: -Patient reports compliance with atorvastatin 80 mg and niacin 500 mg (of note last prescription for niacin 500 mg on file is 12/29/2018). -Patient is nonfasting today so will collect fasting lipid panel at follow-up visit.  Will collect hepatic function for medication monitoring. -Recommend to follow a heart healthy diet low in saturated fats.  Mood disorder: -Stable. -Continue current medication regimen. -Will continue to monitor.  Erectile dysfunction, unspecified erectile dysfunction type: -Stable. -Patient tolerating sildenafil without issues, continue medication as needed. -Will continue to monitor.  Acute non-recurrent sinusitis: -Due to recent exposures at work collected influenza swab which resulted negative. Patient has signs and symptoms consistent with bacterial sinusitis, symptoms ongoing for more than 10 days so will start oral antibiotic therapy with azithromycin.  Recommend to continue home supportive care.  Monitor for worsening symptoms.  Follow-up if symptoms fail to improve or worsen.  Meds ordered this encounter  Medications   azithromycin (ZITHROMAX) 250  MG tablet    Sig: Take 2 tablets on day 1, then 1 tablet daily on days 2 through 5    Dispense:  6 tablet    Refill:  0    Order Specific Question:   Supervising Provider    Answer:   Beatrice Lecher D [[id number]    Follow-up: Return in about 4  months (around 08/13/2021) for HTN, Mood, ED and FBW.   Note:  This note was prepared with assistance of Dragon voice recognition software. Occasional wrong-word or sound-a-like substitutions may have occurred due to the inherent limitations of voice recognition software.  Lorrene Reid, PA-C

## 2021-04-15 LAB — CBC WITH DIFFERENTIAL/PLATELET
Basophils Absolute: 0.1 10*3/uL (ref 0.0–0.2)
Basos: 1 %
EOS (ABSOLUTE): 0.2 10*3/uL (ref 0.0–0.4)
Eos: 2 %
Hematocrit: 38.9 % (ref 37.5–51.0)
Hemoglobin: 13 g/dL (ref 13.0–17.7)
Immature Grans (Abs): 0 10*3/uL (ref 0.0–0.1)
Immature Granulocytes: 0 %
Lymphocytes Absolute: 1.5 10*3/uL (ref 0.7–3.1)
Lymphs: 15 %
MCH: 29.7 pg (ref 26.6–33.0)
MCHC: 33.4 g/dL (ref 31.5–35.7)
MCV: 89 fL (ref 79–97)
Monocytes Absolute: 0.7 10*3/uL (ref 0.1–0.9)
Monocytes: 7 %
Neutrophils Absolute: 7.4 10*3/uL — ABNORMAL HIGH (ref 1.4–7.0)
Neutrophils: 75 %
Platelets: 336 10*3/uL (ref 150–450)
RBC: 4.37 x10E6/uL (ref 4.14–5.80)
RDW: 13.7 % (ref 11.6–15.4)
WBC: 9.9 10*3/uL (ref 3.4–10.8)

## 2021-04-15 LAB — COMPREHENSIVE METABOLIC PANEL
ALT: 27 IU/L (ref 0–44)
AST: 23 IU/L (ref 0–40)
Albumin/Globulin Ratio: 1.6 (ref 1.2–2.2)
Albumin: 4.3 g/dL (ref 3.8–4.9)
Alkaline Phosphatase: 155 IU/L — ABNORMAL HIGH (ref 44–121)
BUN/Creatinine Ratio: 15 (ref 9–20)
BUN: 14 mg/dL (ref 6–24)
Bilirubin Total: 0.3 mg/dL (ref 0.0–1.2)
CO2: 23 mmol/L (ref 20–29)
Calcium: 9.2 mg/dL (ref 8.7–10.2)
Chloride: 101 mmol/L (ref 96–106)
Creatinine, Ser: 0.93 mg/dL (ref 0.76–1.27)
Globulin, Total: 2.7 g/dL (ref 1.5–4.5)
Glucose: 96 mg/dL (ref 70–99)
Potassium: 4.7 mmol/L (ref 3.5–5.2)
Sodium: 138 mmol/L (ref 134–144)
Total Protein: 7 g/dL (ref 6.0–8.5)
eGFR: 96 mL/min/{1.73_m2} (ref >59)

## 2021-04-23 ENCOUNTER — Other Ambulatory Visit: Payer: Self-pay | Admitting: Physician Assistant

## 2021-04-23 DIAGNOSIS — I10 Essential (primary) hypertension: Secondary | ICD-10-CM

## 2021-04-23 DIAGNOSIS — F4323 Adjustment disorder with mixed anxiety and depressed mood: Secondary | ICD-10-CM

## 2021-04-24 ENCOUNTER — Other Ambulatory Visit: Payer: Self-pay | Admitting: Physician Assistant

## 2021-04-24 DIAGNOSIS — M159 Polyosteoarthritis, unspecified: Secondary | ICD-10-CM

## 2021-06-19 ENCOUNTER — Other Ambulatory Visit: Payer: Self-pay | Admitting: Physician Assistant

## 2021-06-19 DIAGNOSIS — F4323 Adjustment disorder with mixed anxiety and depressed mood: Secondary | ICD-10-CM

## 2021-06-19 DIAGNOSIS — F39 Unspecified mood [affective] disorder: Secondary | ICD-10-CM

## 2021-08-16 NOTE — Progress Notes (Signed)
? ?Established Patient Office Visit ? ?Subjective:  ?Patient ID: Aaron Cooper, male    DOB: 1965-07-20  Age: 56 y.o. MRN: [id number] ? ?CC:  ?Chief Complaint  ?Patient presents with  ? Follow-up  ? ? ?HPI ?Aaron Cooper presents for follow-up on hypertension, hyperlipidemia and mood. Patient c/o of dry cough x 3 weeks. No fever, chills, congestion, sinus pressure, headache, wheezing, shortness of breath or sore throat. Reports continues to smoke the same, has not reduced use.  ? ?HTN: Pt denies chest pain, palpitations, dizziness, headache or leg swelling. Taking medication as directed without side effects. Checks BP at home sometimes and readings range <130/80. . ? ?HLD: Pt taking medication as directed without issues. No changes with his diet. No increased intake of fried or fatty foods.  ? ?Mood: Reports mood has remained stable. Denies irritability, labile mood or SI/HI. Taking medications as directed without issues.  ? ?ED: Patient reports takes medication about every 2 weeks. Denies chest pain, palpitations or syncope.  ? ? ?  08/18/2021  ?  9:39 AM 04/14/2021  ?  9:55 AM 11/29/2020  ? 11:26 AM 06/17/2020  ? 10:54 AM 02/23/2020  ?  9:49 AM  ?Depression screen PHQ 2/9  ?Decreased Interest 1 1 1 2  0  ?Down, Depressed, Hopeless 0 0 0 2 0  ?PHQ - 2 Score 1 1 1 4  0  ?Altered sleeping 0 1 0 0 0  ?Tired, decreased energy 1 1 0 1 1  ?Change in appetite 0 0 0 0 0  ?Feeling bad or failure about yourself  0 0 1 0 0  ?Trouble concentrating 0 0 1 0 0  ?Moving slowly or fidgety/restless 0 0 1 0 0  ?Suicidal thoughts 0 0 0 0 0  ?PHQ-9 Score 2 3 4 5 1   ?Difficult doing work/chores     Not difficult at all  ? ? ?  08/18/2021  ?  9:40 AM 04/14/2021  ?  9:56 AM 11/29/2020  ? 11:27 AM 09/25/2018  ? 11:44 AM  ?GAD 7 : Generalized Anxiety Score  ?Nervous, Anxious, on Edge 0 0 0 0  ?Control/stop worrying 0 0 1 0  ?Worry too much - different things 0 0 0 0  ?Trouble relaxing 0 0 1 0  ?Restless 0 0 1 0  ?Easily annoyed or irritable 0 0  0 1  ?Afraid - awful might happen 0 0 0 0  ?Total GAD 7 Score 0 0 3 1  ?Anxiety Difficulty   Somewhat difficult Not difficult at all  ? ? ? ? ?Past Medical History:  ?Diagnosis Date  ? Arthritis   ? Depression   ? Fatigue 11/09/2015  ? HLD (hyperlipidemia) 11/09/2015  ? HTN (hypertension) 11/09/2015  ? Mood disorder in conditions classified elsewhere 11/09/2015  ? Obesity (BMI 30-39.9) 11/09/2015  ? Overweight (BMI 25.0-29.9) 11/09/2015  ? Vitamin D insufficiency 11/09/2015  ? ? ?Past Surgical History:  ?Procedure Laterality Date  ? BACK SURGERY  2013  ? HAND SURGERY Right   ? KNEE SURGERY Right 1995  ? ? ?Family History  ?Problem Relation Age of Onset  ? Diabetes Mother   ? Hyperlipidemia Mother   ? Heart attack Father   ? Hyperlipidemia Father   ? Hypertension Father   ? Cancer Sister   ?     breast  ? Stroke Maternal Aunt   ? Alcohol abuse Maternal Grandmother   ? Heart attack Brother   ? Alcohol abuse Brother   ? ? ?  Social History  ? ?Socioeconomic History  ? Marital status: Married  ?  Spouse name: Not on file  ? Number of children: Not on file  ? Years of education: Not on file  ? Highest education level: Not on file  ?Occupational History  ? Not on file  ?Tobacco Use  ? Smoking status: Every Day  ?  Packs/day: 1.50  ?  Years: 25.00  ?  Pack years: 37.50  ?  Types: Cigarettes  ? Smokeless tobacco: Never  ? Tobacco comments:  ?  Pt refused cessation material  ?Vaping Use  ? Vaping Use: Never used  ?Substance and Sexual Activity  ? Alcohol use: No  ? Drug use: No  ? Sexual activity: Yes  ?Other Topics Concern  ? Not on file  ?Social History Narrative  ? Not on file  ? ?Social Determinants of Health  ? ?Financial Resource Strain: Not on file  ?Food Insecurity: Not on file  ?Transportation Needs: Not on file  ?Physical Activity: Not on file  ?Stress: Not on file  ?Social Connections: Not on file  ?Intimate Partner Violence: Not on file  ? ? ?Outpatient Medications Prior to Visit  ?Medication Sig Dispense Refill  ?  amLODipine-valsartan (EXFORGE) 10-320 MG tablet TAKE 1 TABLET BY MOUTH DAILY 90 tablet 1  ? aspirin 325 MG tablet Take 325 mg by mouth daily.    ? lamoTRIgine (LAMICTAL) 100 MG tablet TAKE 1/2 TABLET(50 MG) BY MOUTH TWICE DAILY 60 tablet 0  ? meloxicam (MOBIC) 15 MG tablet TAKE 1 TABLET(15 MG) BY MOUTH DAILY 60 tablet 0  ? niacin 500 MG CR capsule Take 1 capsule (500 mg total) by mouth 2 (two) times daily with a meal. 180 capsule 0  ? sertraline (ZOLOFT) 100 MG tablet TAKE 1 TABLET(100 MG) BY MOUTH DAILY 90 tablet 1  ? Vitamin D, Ergocalciferol, (DRISDOL) 1.25 MG (50000 UNIT) CAPS capsule Take one tablet wkly **NEEDS APT FOR REFILLS** 8 capsule 0  ? atorvastatin (LIPITOR) 80 MG tablet Take 1 tablet (80 mg total) by mouth daily. TAKE 1 TABLET BY MOUTH EVERY NIGHT AT BEDTIME**NEEDS APT FOR FURTHER REFILLS** 60 tablet 0  ? hydrochlorothiazide (MICROZIDE) 12.5 MG capsule Take 1 capsule (12.5 mg total) by mouth daily. 90 capsule 0  ? sildenafil (VIAGRA) 25 MG tablet Take 1 tablet (25 mg total) by mouth daily as needed for erectile dysfunction. 10 tablet 0  ? ?No facility-administered medications prior to visit.  ? ? ?No Known Allergies ? ?ROS ?Review of Systems ?Review of Systems:  ?A fourteen system review of systems was performed and found to be positive as per HPI. ? ?  ?Objective:  ?  ?Physical Exam ?General:  Well Developed, well nourished, appropriate for stated age.  ?Neuro:  Alert and oriented,  extra-ocular muscles intact  ?HEENT:  Normocephalic, atraumatic, neck supple  ?Skin:  no gross rash, warm, pink. ?Cardiac:  RRR, S1 S2 ?Respiratory: CTA B/L w/ slight ronchi at lung bases, no crackles or rales.  ?Vascular:  Ext warm, no cyanosis apprec.; cap RF less 2 sec. ?Psych:  No HI/SI, judgement and insight good, Euthymic mood. Full Affect. ? ?BP 112/64   Pulse 66   Temp 98.1 ?F (36.7 ?C)   Ht 5' 11"  (1.803 m)   Wt 204 lb 6.4 oz (92.7 kg)   SpO2 97%   BMI 28.51 kg/m?  ?Wt Readings from Last 3 Encounters:   ?08/18/21 204 lb 6.4 oz (92.7 kg)  ?04/14/21 208 lb 8 oz (  94.6 kg)  ?01/13/21 204 lb (92.5 kg)  ? ? ? ?Health Maintenance Due  ?Topic Date Due  ? COVID-19 Vaccine (1) Never done  ? Hepatitis C Screening  Never done  ? ? ?There are no preventive care reminders to display for this patient. ? ?Lab Results  ?Component Value Date  ? TSH 0.655 02/23/2020  ? ?Lab Results  ?Component Value Date  ? WBC 9.9 04/14/2021  ? HGB 13.0 04/14/2021  ? HCT 38.9 04/14/2021  ? MCV 89 04/14/2021  ? PLT 336 04/14/2021  ? ?Lab Results  ?Component Value Date  ? NA 138 04/14/2021  ? K 4.7 04/14/2021  ? CO2 23 04/14/2021  ? GLUCOSE 96 04/14/2021  ? BUN 14 04/14/2021  ? CREATININE 0.93 04/14/2021  ? BILITOT 0.3 04/14/2021  ? ALKPHOS 155 (H) 04/14/2021  ? AST 23 04/14/2021  ? ALT 27 04/14/2021  ? PROT 7.0 04/14/2021  ? ALBUMIN 4.3 04/14/2021  ? CALCIUM 9.2 04/14/2021  ? EGFR 96 04/14/2021  ? ?Lab Results  ?Component Value Date  ? CHOL 215 (H) 11/29/2020  ? ?Lab Results  ?Component Value Date  ? HDL 26 (L) 11/29/2020  ? ?Lab Results  ?Component Value Date  ? Halliday 135 (H) 11/29/2020  ? ?Lab Results  ?Component Value Date  ? TRIG 296 (H) 11/29/2020  ? ?Lab Results  ?Component Value Date  ? CHOLHDL 8.3 (H) 11/29/2020  ? ?Lab Results  ?Component Value Date  ? HGBA1C 5.6 11/29/2020  ? ? ?  ?Assessment & Plan:  ? ?Problem List Items Addressed This Visit   ? ?  ? Cardiovascular and Mediastinum  ? HTN (hypertension) - Primary (Chronic)  ?  -Controlled. ?-Continue current medication regimen. ?-Last CMP, renal function and electrolytes normal. ?-Will continue to monitor. ? ?  ?  ? Relevant Medications  ? hydrochlorothiazide (MICROZIDE) 12.5 MG capsule  ? atorvastatin (LIPITOR) 80 MG tablet  ? sildenafil (VIAGRA) 25 MG tablet  ?  ? Other  ? Mood disorder (HCC) (Chronic)  ?  -Stable.  ?-Continue Lamictal 50 mg BID and Sertraline 100 mg. ?-Will continue to monitor. ? ?  ?  ? HLD (hyperlipidemia) (Chronic)  ?  -Last lipid panel: HDL26, [id number] ?-Continue  atorvastatin 80 mg, provided refill. ?-Advised to fast for next f/up visit to obtain fasting lipid panel. ?-Discussed heart healthy diet. ?-Will continue to monitor. ? ?  ?  ? Relevant Medications  ? hydrochlorothiazide (MICR

## 2021-08-18 ENCOUNTER — Ambulatory Visit (INDEPENDENT_AMBULATORY_CARE_PROVIDER_SITE_OTHER): Payer: BC Managed Care – PPO | Admitting: Physician Assistant

## 2021-08-18 ENCOUNTER — Encounter: Payer: Self-pay | Admitting: Physician Assistant

## 2021-08-18 VITALS — BP 112/64 | HR 66 | Temp 98.1°F | Ht 71.0 in | Wt 204.4 lb

## 2021-08-18 DIAGNOSIS — F39 Unspecified mood [affective] disorder: Secondary | ICD-10-CM | POA: Diagnosis not present

## 2021-08-18 DIAGNOSIS — I10 Essential (primary) hypertension: Secondary | ICD-10-CM

## 2021-08-18 DIAGNOSIS — J40 Bronchitis, not specified as acute or chronic: Secondary | ICD-10-CM

## 2021-08-18 DIAGNOSIS — E782 Mixed hyperlipidemia: Secondary | ICD-10-CM

## 2021-08-18 DIAGNOSIS — N529 Male erectile dysfunction, unspecified: Secondary | ICD-10-CM

## 2021-08-18 MED ORDER — SILDENAFIL CITRATE 25 MG PO TABS: 25.0000 mg | ORAL_TABLET | Freq: Every day | ORAL | 0 refills | Status: DC | PRN

## 2021-08-18 MED ORDER — ATORVASTATIN CALCIUM 80 MG PO TABS: 80.0000 mg | ORAL_TABLET | Freq: Every day | ORAL | 1 refills | Status: DC

## 2021-08-18 MED ORDER — METHYLPREDNISOLONE 4 MG PO TBPK: ORAL_TABLET | ORAL | 0 refills | Status: DC

## 2021-08-18 MED ORDER — HYDROCHLOROTHIAZIDE 12.5 MG PO CAPS: 12.5000 mg | ORAL_CAPSULE | Freq: Every day | ORAL | 1 refills | Status: DC

## 2021-08-18 NOTE — Patient Instructions (Signed)

## 2021-08-18 NOTE — Assessment & Plan Note (Signed)
-  Controlled. -Continue current medication regimen. Last CMP, renal function and electrolytes normal. -Will continue to monitor. 

## 2021-08-18 NOTE — Assessment & Plan Note (Signed)
-  Stable. Continue sildenafil 25 mg as needed. Will continue to monitor. Discussed with patient we can obtain testosterone labs at /fup visit if early in the morning to see if low testosterone contributing to symptoms. Pt verbalized understanding. ?

## 2021-08-18 NOTE — Assessment & Plan Note (Signed)
-  Last lipid panel: HDL26, [id number] ?-Continue atorvastatin 80 mg, provided refill. ?-Advised to fast for next f/up visit to obtain fasting lipid panel. ?-Discussed heart healthy diet. ?-Will continue to monitor. ?

## 2021-08-18 NOTE — Assessment & Plan Note (Signed)
-  Stable.  ?-Continue Lamictal 50 mg BID and Sertraline 100 mg. ?-Will continue to monitor. ?

## 2021-10-18 ENCOUNTER — Other Ambulatory Visit: Payer: Self-pay | Admitting: Physician Assistant

## 2021-10-18 DIAGNOSIS — F4323 Adjustment disorder with mixed anxiety and depressed mood: Secondary | ICD-10-CM

## 2021-12-20 ENCOUNTER — Ambulatory Visit: Payer: BC Managed Care – PPO | Admitting: Physician Assistant

## 2022-01-28 ENCOUNTER — Other Ambulatory Visit: Payer: Self-pay | Admitting: Physician Assistant

## 2022-01-28 DIAGNOSIS — F4323 Adjustment disorder with mixed anxiety and depressed mood: Secondary | ICD-10-CM

## 2022-01-28 DIAGNOSIS — I10 Essential (primary) hypertension: Secondary | ICD-10-CM

## 2022-02-15 ENCOUNTER — Encounter: Payer: Self-pay | Admitting: Physician Assistant

## 2022-02-15 ENCOUNTER — Ambulatory Visit (INDEPENDENT_AMBULATORY_CARE_PROVIDER_SITE_OTHER): Payer: BC Managed Care – PPO | Admitting: Physician Assistant

## 2022-02-15 VITALS — BP 114/67 | HR 70 | Ht 71.0 in | Wt 202.0 lb

## 2022-02-15 DIAGNOSIS — M159 Polyosteoarthritis, unspecified: Secondary | ICD-10-CM

## 2022-02-15 DIAGNOSIS — E559 Vitamin D deficiency, unspecified: Secondary | ICD-10-CM

## 2022-02-15 DIAGNOSIS — F39 Unspecified mood [affective] disorder: Secondary | ICD-10-CM

## 2022-02-15 DIAGNOSIS — Z87898 Personal history of other specified conditions: Secondary | ICD-10-CM

## 2022-02-15 DIAGNOSIS — E782 Mixed hyperlipidemia: Secondary | ICD-10-CM

## 2022-02-15 DIAGNOSIS — R6882 Decreased libido: Secondary | ICD-10-CM

## 2022-02-15 DIAGNOSIS — N529 Male erectile dysfunction, unspecified: Secondary | ICD-10-CM | POA: Diagnosis not present

## 2022-02-15 DIAGNOSIS — I10 Essential (primary) hypertension: Secondary | ICD-10-CM

## 2022-02-15 MED ORDER — SILDENAFIL CITRATE 25 MG PO TABS: 25.0000 mg | ORAL_TABLET | Freq: Every day | ORAL | 0 refills | Status: AC | PRN

## 2022-02-15 MED ORDER — HYDROCHLOROTHIAZIDE 12.5 MG PO CAPS: 12.5000 mg | ORAL_CAPSULE | Freq: Every day | ORAL | 1 refills | Status: DC

## 2022-02-15 MED ORDER — ATORVASTATIN CALCIUM 80 MG PO TABS: 80.0000 mg | ORAL_TABLET | Freq: Every day | ORAL | 1 refills | Status: DC

## 2022-02-15 MED ORDER — MELOXICAM 15 MG PO TABS: ORAL_TABLET | ORAL | 0 refills | Status: DC

## 2022-02-15 MED ORDER — LAMOTRIGINE 100 MG PO TABS: ORAL_TABLET | ORAL | 0 refills | Status: DC

## 2022-02-15 NOTE — Assessment & Plan Note (Signed)
-  Requesting refill of meloxicam 15 mg which provides pain relief when needed. Will collect CMP to monitor renal function.

## 2022-02-15 NOTE — Assessment & Plan Note (Signed)
-  Controlled. Continue amlodipine-valsartan 10-320 mg and HCTZ 12.5 mg. Will collect CMP to monitor renal function and electrolytes.

## 2022-02-15 NOTE — Assessment & Plan Note (Signed)
-  Last lipid panel elevated. Will collect fasting lipid panel and hepatic function today. Recommend to continue atorvastatin 80 mg daily. Recommend to follow a heart healthy diet low in fat.

## 2022-02-15 NOTE — Assessment & Plan Note (Addendum)
-  Stable. Continue sildenafil 25 mg as needed. Patient reports decreased libido so will collect testosterone lab. If labs unremarkable and symptoms worsen recommend considering changing SSRI therapy.

## 2022-02-15 NOTE — Progress Notes (Signed)
Established patient visit   Patient: Aaron Cooper   DOB: 11-25-1965   56 y.o. Male  MRN: [id number] Visit Date: 02/15/2022  Chief Complaint  Patient presents with   Follow-up   Subjective    HPI  Patient presents for chronic follow-up. Reports is fasting for blood-work.  Mood: Patient reports unfortunately had to put his dog down last week so the last couple of days have been a little rough. No SI/HI. Taking medications as directed without  issues. Needs refills.   HTN: Pt denies chest pain, palpitations, dizziness, syncope or lower extremity swelling. Taking medication as directed without side effects.   HLD: Pt reports medication compliance. No changes with his diet from usual.   ED: Patient reports sildenafil 25 mg is helpful. Does report noticing some decreased libido. No worsening fatigue from baseline.     02/15/2022   10:34 AM 08/18/2021    9:39 AM 04/14/2021    9:55 AM 11/29/2020   11:26 AM 06/17/2020   10:54 AM  Depression screen PHQ 2/9  Decreased Interest _0 Down, Depressed, Hopeless 0 0 0 0 2  PHQ - 2 Score _1 Altered sleeping 0 0 1 0 0  Tired, decreased energy 0 1 1 0 1  Change in appetite 0 0 0 0 0  Feeling bad or failure about yourself  0 0 0 1 0  Trouble concentrating 1 0 0 1 0  Moving slowly or fidgety/restless 0 0 0 1 0  Suicidal thoughts 0 0 0 0 0  PHQ-9 Score _2 02/15/2022   10:34 AM 08/18/2021    9:40 AM 04/14/2021    9:56 AM 11/29/2020   11:27 AM  GAD 7 : Generalized Anxiety Score  Nervous, Anxious, on Edge 0 0 0 0  Control/stop worrying 0 0 0 1  Worry too much - different things 0 0 0 0  Trouble relaxing 0 0 0 1  Restless 0 0 0 1  Easily annoyed or irritable 1 0 0 0  Afraid - awful might happen 0 0 0 0  Total GAD 7 Score 1 0 0 3  Anxiety Difficulty    Somewhat difficult      Medications: Outpatient Medications Prior to Visit  Medication Sig   amLODipine-valsartan (EXFORGE) 10-320 MG tablet TAKE 1 TABLET  BY MOUTH DAILY   aspirin 325 MG tablet Take 325 mg by mouth daily.   niacin 500 MG CR capsule Take 1 capsule (500 mg total) by mouth 2 (two) times daily with a meal.   sertraline (ZOLOFT) 100 MG tablet TAKE 1 TABLET(100 MG) BY MOUTH DAILY   Vitamin D, Ergocalciferol, (DRISDOL) 1.25 MG (50000 UNIT) CAPS capsule Take one tablet wkly **NEEDS APT FOR REFILLS**   [DISCONTINUED] atorvastatin (LIPITOR) 80 MG tablet Take 1 tablet (80 mg total) by mouth daily. TAKE 1 TABLET BY MOUTH EVERY NIGHT AT BEDTIME   [DISCONTINUED] hydrochlorothiazide (MICROZIDE) 12.5 MG capsule Take 1 capsule (12.5 mg total) by mouth daily.   [DISCONTINUED] lamoTRIgine (LAMICTAL) 100 MG tablet TAKE 1/2 TABLET(50 MG) BY MOUTH TWICE DAILY   [DISCONTINUED] meloxicam (MOBIC) 15 MG tablet TAKE 1 TABLET(15 MG) BY MOUTH DAILY   [DISCONTINUED] methylPREDNISolone (MEDROL DOSEPAK) 4 MG TBPK tablet Take as directed on package.   [DISCONTINUED] sildenafil (VIAGRA) 25 MG tablet Take 1 tablet (25 mg total) by mouth daily as needed for erectile dysfunction.   No facility-administered  medications prior to visit.    Review of Systems Review of Systems:  A fourteen system review of systems was performed and found to be positive as per HPI.  Last CBC Lab Results  Component Value Date   WBC 9.9 04/14/2021   HGB 13.0 04/14/2021   HCT 38.9 04/14/2021   MCV 89 04/14/2021   MCH 29.7 04/14/2021   RDW 13.7 04/14/2021   PLT 336 88/28/0034   Last metabolic panel Lab Results  Component Value Date   GLUCOSE 96 04/14/2021   NA 138 04/14/2021   K 4.7 04/14/2021   CL 101 04/14/2021   CO2 23 04/14/2021   BUN 14 04/14/2021   CREATININE 0.93 04/14/2021   EGFR 96 04/14/2021   CALCIUM 9.2 04/14/2021   PROT 7.0 04/14/2021   ALBUMIN 4.3 04/14/2021   LABGLOB 2.7 04/14/2021   AGRATIO 1.6 04/14/2021   BILITOT 0.3 04/14/2021   ALKPHOS 155 (H) 04/14/2021   AST 23 04/14/2021   ALT 27 04/14/2021   Last lipids Lab Results  Component Value Date    CHOL 215 (H) 11/29/2020   HDL 26 (L) 11/29/2020   LDLCALC 135 (H) 11/29/2020   TRIG 296 (H) 11/29/2020   CHOLHDL 8.3 (H) 11/29/2020   Last hemoglobin A1c Lab Results  Component Value Date   HGBA1C 5.6 11/29/2020   Last thyroid functions Lab Results  Component Value Date   TSH 0.655 02/23/2020   T3TOTAL 107 01/07/2019     Objective    BP 114/67   Pulse 70   Ht _0  (1.803 m)   Wt 202 lb (91.6 kg)   SpO2 97%   BMI 28.17 kg/m  BP Readings from Last 3 Encounters:  02/15/22 114/67  08/18/21 112/64  04/14/21 119/66   Wt Readings from Last 3 Encounters:  02/15/22 202 lb (91.6 kg)  08/18/21 204 lb 6.4 oz (92.7 kg)  04/14/21 208 lb 8 oz (94.6 kg)    Physical Exam  General:  Well Developed, well nourished, appropriate for stated age.  Neuro:  Alert and oriented,  extra-ocular muscles intact  HEENT:  Normocephalic, atraumatic, neck supple  Skin:  no gross rash, warm, pink. Cardiac:  RRR, S1 S2 Respiratory: CTA B/L  Vascular:  Ext warm, no cyanosis apprec.; cap RF less 2 sec. Psych:  No HI/SI, judgement and insight good, Euthymic mood. Full Affect.   No results found for any visits on 02/15/22.  Assessment & Plan      Problem List Items Addressed This Visit       Cardiovascular and Mediastinum   HTN (hypertension) (Chronic)    -Controlled. Continue amlodipine-valsartan 10-320 mg and HCTZ 12.5 mg. Will collect CMP to monitor renal function and electrolytes.      Relevant Medications   atorvastatin (LIPITOR) 80 MG tablet   hydrochlorothiazide (MICROZIDE) 12.5 MG capsule   sildenafil (VIAGRA) 25 MG tablet   Other Relevant Orders   CBC w/Diff   Comp Met (CMET)     Musculoskeletal and Integument   Generalized osteoarthritis of multiple sites    -Requesting refill of meloxicam 15 mg which provides pain relief when needed. Will collect CMP to monitor renal function.      Relevant Medications   meloxicam (MOBIC) 15 MG tablet     Other   Mood disorder  (HCC) - Primary (Chronic)    -Overall stable. PHQ-9 score of 2, GAD-7 score of 1. Continue Lamictal  50 mg BID and sertraline 100 mg daily. Recommend considering changing SSRI  therapy if sexual dysfunction worsens.      Relevant Medications   lamoTRIgine (LAMICTAL) 100 MG tablet   HLD (hyperlipidemia) (Chronic)    -Last lipid panel elevated. Will collect fasting lipid panel and hepatic function today. Recommend to continue atorvastatin 80 mg daily. Recommend to follow a heart healthy diet low in fat.      Relevant Medications   atorvastatin (LIPITOR) 80 MG tablet   hydrochlorothiazide (MICROZIDE) 12.5 MG capsule   sildenafil (VIAGRA) 25 MG tablet   Other Relevant Orders   CBC w/Diff   Comp Met (CMET)   Lipid Profile   Vitamin D deficiency   Relevant Orders   Vitamin D (25 hydroxy)   Erectile dysfunction    -Stable. Continue sildenafil 25 mg as needed. Patient reports decreased libido so will collect testosterone lab. If labs unremarkable and symptoms worsen recommend considering changing SSRI therapy.       Relevant Medications   sildenafil (VIAGRA) 25 MG tablet   Other Relevant Orders   Testosterone,Free and Total   Other Visit Diagnoses     Decreased libido       Relevant Orders   Testosterone,Free and Total   History of prediabetes       Relevant Orders   HgB A1c       Return in about 4 months (around 06/18/2022) for CPE.        Lorrene Reid, PA-C  [facility] [phone] (phone) [phone] (fax)  [place]

## 2022-02-15 NOTE — Assessment & Plan Note (Signed)
-  Overall stable. PHQ-9 score of 2, GAD-7 score of 1. Continue Lamictal  50 mg BID and sertraline 100 mg daily. Recommend considering changing SSRI therapy if sexual dysfunction worsens.

## 2022-02-15 NOTE — Patient Instructions (Signed)

## 2022-02-20 ENCOUNTER — Other Ambulatory Visit: Payer: Self-pay | Admitting: Physician Assistant

## 2022-02-20 DIAGNOSIS — E782 Mixed hyperlipidemia: Secondary | ICD-10-CM

## 2022-02-20 DIAGNOSIS — I10 Essential (primary) hypertension: Secondary | ICD-10-CM

## 2022-02-21 LAB — CBC WITH DIFFERENTIAL/PLATELET
Basophils Absolute: 0.1 10*3/uL (ref 0.0–0.2)
Basos: 1 %
EOS (ABSOLUTE): 0.1 10*3/uL (ref 0.0–0.4)
Eos: 1 %
Hematocrit: 38.9 % (ref 37.5–51.0)
Hemoglobin: 13.3 g/dL (ref 13.0–17.7)
Immature Grans (Abs): 0 10*3/uL (ref 0.0–0.1)
Immature Granulocytes: 0 %
Lymphocytes Absolute: 1.4 10*3/uL (ref 0.7–3.1)
Lymphs: 16 %
MCH: 30.2 pg (ref 26.6–33.0)
MCHC: 34.2 g/dL (ref 31.5–35.7)
MCV: 88 fL (ref 79–97)
Monocytes Absolute: 0.6 10*3/uL (ref 0.1–0.9)
Monocytes: 6 %
Neutrophils Absolute: 6.7 10*3/uL (ref 1.4–7.0)
Neutrophils: 76 %
Platelets: 287 10*3/uL (ref 150–450)
RBC: 4.4 x10E6/uL (ref 4.14–5.80)
RDW: 13.9 % (ref 11.6–15.4)
WBC: 8.9 10*3/uL (ref 3.4–10.8)

## 2022-02-21 LAB — COMPREHENSIVE METABOLIC PANEL
ALT: 15 IU/L (ref 0–44)
AST: 14 IU/L (ref 0–40)
Albumin/Globulin Ratio: 1.6 (ref 1.2–2.2)
Albumin: 4.6 g/dL (ref 3.8–4.9)
Alkaline Phosphatase: 134 IU/L — ABNORMAL HIGH (ref 44–121)
BUN/Creatinine Ratio: 21 — ABNORMAL HIGH (ref 9–20)
BUN: 24 mg/dL (ref 6–24)
Bilirubin Total: 0.2 mg/dL (ref 0.0–1.2)
CO2: 21 mmol/L (ref 20–29)
Calcium: 9.4 mg/dL (ref 8.7–10.2)
Chloride: 101 mmol/L (ref 96–106)
Creatinine, Ser: 1.13 mg/dL (ref 0.76–1.27)
Globulin, Total: 2.8 g/dL (ref 1.5–4.5)
Glucose: 91 mg/dL (ref 70–99)
Potassium: 5 mmol/L (ref 3.5–5.2)
Sodium: 137 mmol/L (ref 134–144)
Total Protein: 7.4 g/dL (ref 6.0–8.5)
eGFR: 76 mL/min/{1.73_m2} (ref >59)

## 2022-02-21 LAB — LIPID PANEL
Chol/HDL Ratio: 8.4 ratio — ABNORMAL HIGH (ref 0.0–5.0)
Cholesterol, Total: 234 mg/dL — ABNORMAL HIGH (ref 100–199)
HDL: 28 mg/dL — ABNORMAL LOW (ref >39)
LDL Chol Calc (NIH): 158 mg/dL — ABNORMAL HIGH (ref 0–99)
Triglycerides: 259 mg/dL — ABNORMAL HIGH (ref 0–149)
VLDL Cholesterol Cal: 48 mg/dL — ABNORMAL HIGH (ref 5–40)

## 2022-02-21 LAB — HEMOGLOBIN A1C
Est. average glucose Bld gHb Est-mCnc: 111 mg/dL
Hgb A1c MFr Bld: 5.5 % (ref 4.8–5.6)

## 2022-02-21 LAB — TESTOSTERONE,FREE AND TOTAL
Testosterone, Free: 6.9 pg/mL — ABNORMAL LOW (ref 7.2–24.0)
Testosterone: 449 ng/dL (ref 264–916)

## 2022-02-21 LAB — VITAMIN D 25 HYDROXY (VIT D DEFICIENCY, FRACTURES): Vit D, 25-Hydroxy: 56.2 ng/mL (ref 30.0–100.0)

## 2022-04-12 ENCOUNTER — Other Ambulatory Visit: Payer: Self-pay

## 2022-04-12 DIAGNOSIS — M159 Polyosteoarthritis, unspecified: Secondary | ICD-10-CM

## 2022-04-12 MED ORDER — MELOXICAM 15 MG PO TABS: ORAL_TABLET | ORAL | 0 refills | Status: DC

## 2022-04-26 ENCOUNTER — Telehealth: Payer: Self-pay | Admitting: *Deleted

## 2022-04-26 NOTE — Telephone Encounter (Signed)
Please let the m know I already have aspirin 325 mg daily on his medication list. I hope that means he is taking it.

## 2022-04-26 NOTE — Telephone Encounter (Signed)
Sierra from [place] [facility] calling on behalf of Dr. Constance Goltz calling to see about getting patient started on 325 mg ASA daily and your thoughts.  Please call with any questions. Kreig Parson Zimmerman Rumple, CMA

## 2022-04-27 NOTE — Telephone Encounter (Signed)
Called and gave [facility] the below message and then tried to call patient to be sure he was taking this.  His one number was not in service and Mobile number could not be completed.Nalayah Hitt Zimmerman Rumple, CMA

## 2022-05-02 NOTE — Telephone Encounter (Signed)
Attempted to contact pt again, one number not in service and other one could not be completed. Adriene Knipfer Zimmerman Rumple, CMA

## 2022-05-14 NOTE — Telephone Encounter (Signed)
[name] got the ok that the patient can start 325 ASA if he is not already taking it as its on the current med list.   Aaron Cooper

## 2022-06-17 NOTE — Progress Notes (Unsigned)
Complete physical exam   Patient: Aaron Cooper   DOB: 03-16-1966   57 y.o. Male  MRN: [id number] Visit Date: 06/18/2022    No chief complaint on file.  Subjective    Aaron Cooper is a 57 y.o. male who presents today for a complete physical exam.  He reports consuming a {diet types:17450} diet. {Exercise:19826} He generally feels {well/fairly well/poorly:18703}. He {does/does not:200015} have additional problems to discuss today.   HPI  Annual physical  -hypertension  --generally well controlled.  -mood disorder --generally well managed.  -mild low free testosterone.  -high cholesterol  --mild, generalized elevation of lipid panel.    Past Medical History:  Diagnosis Date   Arthritis    Depression    Fatigue 11/09/2015   HLD (hyperlipidemia) 11/09/2015   HTN (hypertension) 11/09/2015   Mood disorder in conditions classified elsewhere 11/09/2015   Obesity (BMI 30-39.9) 11/09/2015   Overweight (BMI 25.0-29.9) 11/09/2015   Vitamin D insufficiency 11/09/2015   Past Surgical History:  Procedure Laterality Date   BACK SURGERY  2013   HAND SURGERY Right    KNEE SURGERY Right 1995   Social History   Socioeconomic History   Marital status: Married    Spouse name: Not on file   Number of children: Not on file   Years of education: Not on file   Highest education level: Not on file  Occupational History   Not on file  Tobacco Use   Smoking status: Every Day    Packs/day: 1.50    Years: 25.00    Total pack years: 37.50    Types: Cigarettes   Smokeless tobacco: Never   Tobacco comments:    Pt refused cessation material  Vaping Use   Vaping Use: Never used  Substance and Sexual Activity   Alcohol use: No   Drug use: No   Sexual activity: Yes  Other Topics Concern   Not on file  Social History Narrative   Not on file   Social Determinants of Health   Financial Resource Strain: Not on file  Food Insecurity: Not on file  Transportation Needs: Not on file  Physical  Activity: Not on file  Stress: Not on file  Social Connections: Not on file  Intimate Partner Violence: Not on file   Family Status  Relation Name Status   Mother  Deceased   Father  Deceased   Sister  Alive   Brother  Alive   Mat Aunt  Deceased   MGM  Deceased   MGF  Deceased   PGM  Deceased   PGF  Deceased   Brother  Deceased   Family History  Problem Relation Age of Onset   Diabetes Mother    Hyperlipidemia Mother    Heart attack Father    Hyperlipidemia Father    Hypertension Father    Cancer Sister        breast   Stroke Maternal Aunt    Alcohol abuse Maternal Grandmother    Heart attack Brother    Alcohol abuse Brother    No Known Allergies  Patient Care Team: Lorrene Reid, PA-C as PCP - General   Medications: Outpatient Medications Prior to Visit  Medication Sig   amLODipine-valsartan (EXFORGE) 10-320 MG tablet TAKE 1 TABLET BY MOUTH DAILY   aspirin 325 MG tablet Take 325 mg by mouth daily.   atorvastatin (LIPITOR) 80 MG tablet Take 1 tablet (80 mg total) by mouth daily. TAKE 1 TABLET BY MOUTH EVERY NIGHT AT  BEDTIME   hydrochlorothiazide (MICROZIDE) 12.5 MG capsule Take 1 capsule (12.5 mg total) by mouth daily.   lamoTRIgine (LAMICTAL) 100 MG tablet TAKE 1/2 TABLET(50 MG) BY MOUTH TWICE DAILY   meloxicam (MOBIC) 15 MG tablet TAKE 1 TABLET(15 MG) BY MOUTH DAILY   niacin 500 MG CR capsule Take 1 capsule (500 mg total) by mouth 2 (two) times daily with a meal.   sertraline (ZOLOFT) 100 MG tablet TAKE 1 TABLET(100 MG) BY MOUTH DAILY   sildenafil (VIAGRA) 25 MG tablet Take 1 tablet (25 mg total) by mouth daily as needed for erectile dysfunction.   Vitamin D, Ergocalciferol, (DRISDOL) 1.25 MG (50000 UNIT) CAPS capsule Take one tablet wkly **NEEDS APT FOR REFILLS**   No facility-administered medications prior to visit.    Review of Systems  Last CBC Lab Results  Component Value Date   WBC 8.9 02/15/2022   HGB 13.3 02/15/2022   HCT 38.9 02/15/2022   MCV  88 02/15/2022   MCH 30.2 02/15/2022   RDW 13.9 02/15/2022   PLT 287 [id number]   Last metabolic panel Lab Results  Component Value Date   GLUCOSE 91 02/15/2022   NA 137 02/15/2022   K 5.0 02/15/2022   CL 101 02/15/2022   CO2 21 02/15/2022   BUN 24 02/15/2022   CREATININE 1.13 02/15/2022   EGFR 76 02/15/2022   CALCIUM 9.4 02/15/2022   PROT 7.4 02/15/2022   ALBUMIN 4.6 02/15/2022   LABGLOB 2.8 02/15/2022   AGRATIO 1.6 02/15/2022   BILITOT 0.2 02/15/2022   ALKPHOS 134 (H) 02/15/2022   AST 14 02/15/2022   ALT 15 02/15/2022   Last lipids Lab Results  Component Value Date   CHOL 234 (H) 02/15/2022   HDL 28 (L) 02/15/2022   LDLCALC 158 (H) 02/15/2022   TRIG 259 (H) 02/15/2022   CHOLHDL 8.4 (H) 02/15/2022   Last hemoglobin A1c Lab Results  Component Value Date   HGBA1C 5.5 02/15/2022   Last thyroid functions Lab Results  Component Value Date   TSH 0.655 02/23/2020   T3TOTAL 107 01/07/2019   Last vitamin D Lab Results  Component Value Date   VD25OH 56.2 02/15/2022       Objective    There were no vitals filed for this visit. There is no height or weight on file to calculate BMI.  BP Readings from Last 3 Encounters:  02/15/22 114/67  08/18/21 112/64  04/14/21 119/66    Wt Readings from Last 3 Encounters:  02/15/22 202 lb (91.6 kg)  08/18/21 204 lb 6.4 oz (92.7 kg)  04/14/21 208 lb 8 oz (94.6 kg)     Physical Exam  ***  Last depression screening scores   Row Labels 02/15/2022   10:34 AM 08/18/2021    9:39 AM 04/14/2021    9:55 AM  PHQ 2/9 Scores   Section Header. No data exists in this row.     PHQ - 2 Score   1 1 1  $ PHQ- 9 Score   2 2 3   $ Last fall risk screening   Row Labels 08/18/2021    9:40 AM  Fall Risk    Section Header. No data exists in this row.   Falls in the past year?   0  Number falls in past yr:   0  Injury with Fall?   0  Follow up   Falls evaluation completed   Last Audit-C alcohol use screening   No data to display     A  score of 3 or more in women, and 4 or more in men indicates increased risk for alcohol abuse, EXCEPT if all of the points are from question 1   No results found for any visits on 06/18/22.  Assessment & Plan    Routine Health Maintenance and Physical Exam  Exercise Activities and Dietary recommendations  Goals     Goal Patient Stated Notes   Exercise 5x per week (30 min per time) Not patient stated        Immunization History  Administered Date(s) Administered   Influenza,inj,Quad PF,6+ Mos 02/14/2016, 03/07/2018   Tdap 12/10/2016    Health Maintenance  Topic Date Due   COVID-19 Vaccine (1) Never done   Hepatitis C Screening  Never done   COLONOSCOPY (Pts 45-48yr Insurance coverage will need to be confirmed)  Never done   Lung Cancer Screening  Never done   Zoster Vaccines- Shingrix (1 of 2) Never done   INFLUENZA VACCINE  08/05/2022 (Originally 12/05/2021)   DTaP/Tdap/Td (2 - Td or Tdap) 12/11/2026   HIV Screening  Completed   HPV VACCINES  Aged Out    Discussed health benefits of physical activity, and encouraged him to engage in regular exercise appropriate for his age and condition.  Problem List Items Addressed This Visit   None    No follow-ups on file.        HRonnell Freshwater NP  [phone](phone) [phone](fax)  [place]

## 2022-06-18 ENCOUNTER — Ambulatory Visit: Payer: BC Managed Care – PPO | Admitting: Nurse Practitioner

## 2022-06-18 ENCOUNTER — Encounter: Payer: Self-pay | Admitting: Nurse Practitioner

## 2022-06-18 DIAGNOSIS — E782 Mixed hyperlipidemia: Secondary | ICD-10-CM

## 2022-06-18 DIAGNOSIS — F39 Unspecified mood [affective] disorder: Secondary | ICD-10-CM | POA: Diagnosis not present

## 2022-06-18 DIAGNOSIS — M159 Polyosteoarthritis, unspecified: Secondary | ICD-10-CM | POA: Diagnosis not present

## 2022-06-18 DIAGNOSIS — I10 Essential (primary) hypertension: Secondary | ICD-10-CM | POA: Diagnosis not present

## 2022-06-18 DIAGNOSIS — F4323 Adjustment disorder with mixed anxiety and depressed mood: Secondary | ICD-10-CM

## 2022-06-18 MED ORDER — ATORVASTATIN CALCIUM 80 MG PO TABS: 80.0000 mg | ORAL_TABLET | Freq: Every day | ORAL | 1 refills | Status: DC

## 2022-06-18 MED ORDER — SERTRALINE HCL 100 MG PO TABS: ORAL_TABLET | ORAL | 1 refills | Status: DC

## 2022-06-18 MED ORDER — HYDROCHLOROTHIAZIDE 12.5 MG PO CAPS: 12.5000 mg | ORAL_CAPSULE | Freq: Every day | ORAL | 1 refills | Status: DC

## 2022-06-18 MED ORDER — LAMOTRIGINE 100 MG PO TABS: ORAL_TABLET | ORAL | 1 refills | Status: DC

## 2022-06-18 MED ORDER — AMLODIPINE BESYLATE-VALSARTAN 10-320 MG PO TABS: 1.0000 | ORAL_TABLET | Freq: Every day | ORAL | 1 refills | Status: DC

## 2022-06-18 MED ORDER — MELOXICAM 15 MG PO TABS: ORAL_TABLET | ORAL | 1 refills | Status: AC

## 2022-06-18 NOTE — Progress Notes (Signed)
Established patient visit   Patient: Aaron Cooper   DOB: 12-Dec-1965   57 y.o. Male  MRN: [id number] Visit Date: 06/18/2022   Chief Complaint  Patient presents with   Medication Refill   Subjective    Medication Refill Pertinent negatives include no chest pain, chills, congestion, coughing, fatigue, fever, headaches, myalgias, nausea, rash, sore throat, vomiting or weakness.    Follow up  -hypertension  --generally well controlled.  -had been to his eye doctor --found small hemorrhages behind the eyes.  --taking aspirin 325 mg, sometimes four of them daily.  -mood disorder --generally well managed.  -mild low free testosterone.  -high cholesterol  --mild, generalized elevation of lipid panel.   -found out he is going to be losing his job in September. He would like to change today's visit from physical to follow up for medication refills.    Medications: Outpatient Medications Prior to Visit  Medication Sig   aspirin 325 MG tablet Take 325 mg by mouth daily.   niacin 500 MG CR capsule Take 1 capsule (500 mg total) by mouth 2 (two) times daily with a meal.   sildenafil (VIAGRA) 25 MG tablet Take 1 tablet (25 mg total) by mouth daily as needed for erectile dysfunction.   Vitamin D, Ergocalciferol, (DRISDOL) 1.25 MG (50000 UNIT) CAPS capsule Take one tablet wkly **NEEDS APT FOR REFILLS**   [DISCONTINUED] amLODipine-valsartan (EXFORGE) 10-320 MG tablet TAKE 1 TABLET BY MOUTH DAILY   [DISCONTINUED] atorvastatin (LIPITOR) 80 MG tablet Take 1 tablet (80 mg total) by mouth daily. TAKE 1 TABLET BY MOUTH EVERY NIGHT AT BEDTIME   [DISCONTINUED] hydrochlorothiazide (MICROZIDE) 12.5 MG capsule Take 1 capsule (12.5 mg total) by mouth daily.   [DISCONTINUED] lamoTRIgine (LAMICTAL) 100 MG tablet TAKE 1/2 TABLET(50 MG) BY MOUTH TWICE DAILY   [DISCONTINUED] meloxicam (MOBIC) 15 MG tablet TAKE 1 TABLET(15 MG) BY MOUTH DAILY   [DISCONTINUED] sertraline (ZOLOFT) 100 MG tablet TAKE 1 TABLET(100  MG) BY MOUTH DAILY   No facility-administered medications prior to visit.    Review of Systems  Constitutional:  Negative for activity change, chills, fatigue and fever.  HENT:  Negative for congestion, postnasal drip, rhinorrhea, sinus pressure, sinus pain, sneezing and sore throat.   Eyes:  Positive for visual disturbance.       Has recently been diagnosed with small hemorrhages behind the eyes. Taking excess amounts of aspirin.   Respiratory:  Negative for cough, shortness of breath and wheezing.   Cardiovascular:  Negative for chest pain and palpitations.  Gastrointestinal:  Negative for constipation, diarrhea, nausea and vomiting.  Endocrine: Negative for cold intolerance, heat intolerance, polydipsia and polyuria.  Genitourinary:  Negative for dysuria, frequency and urgency.  Musculoskeletal:  Negative for back pain and myalgias.  Skin:  Negative for rash.  Allergic/Immunologic: Negative for environmental allergies.  Neurological:  Negative for dizziness, weakness and headaches.  Psychiatric/Behavioral:  The patient is not nervous/anxious.     Last CBC Lab Results  Component Value Date   WBC 8.9 02/15/2022   HGB 13.3 02/15/2022   HCT 38.9 02/15/2022   MCV 88 02/15/2022   MCH 30.2 02/15/2022   RDW 13.9 02/15/2022   PLT 287 [id number]   Last metabolic panel Lab Results  Component Value Date   GLUCOSE 91 02/15/2022   NA 137 02/15/2022   K 5.0 02/15/2022   CL 101 02/15/2022   CO2 21 02/15/2022   BUN 24 02/15/2022   CREATININE 1.13 02/15/2022   EGFR 76 02/15/2022  CALCIUM 9.4 02/15/2022   PROT 7.4 02/15/2022   ALBUMIN 4.6 02/15/2022   LABGLOB 2.8 02/15/2022   AGRATIO 1.6 02/15/2022   BILITOT 0.2 02/15/2022   ALKPHOS 134 (H) 02/15/2022   AST 14 02/15/2022   ALT 15 02/15/2022   Last lipids Lab Results  Component Value Date   CHOL 234 (H) 02/15/2022   HDL 28 (L) 02/15/2022   LDLCALC 158 (H) 02/15/2022   TRIG 259 (H) 02/15/2022   CHOLHDL 8.4 (H) 02/15/2022    Last hemoglobin A1c Lab Results  Component Value Date   HGBA1C 5.5 02/15/2022   Last thyroid functions Lab Results  Component Value Date   TSH 0.655 02/23/2020   T3TOTAL 107 01/07/2019   Last vitamin D Lab Results  Component Value Date   VD25OH 56.2 02/15/2022       Objective     Today's Vitals   06/18/22 0812  BP: 139/83  Pulse: 72  SpO2: 97%  Weight: 207 lb (93.9 kg)  Height: 5' 11"$  (1.803 m)   Body mass index is 28.87 kg/m.  BP Readings from Last 3 Encounters:  06/18/22 139/83  02/15/22 114/67  08/18/21 112/64    Wt Readings from Last 3 Encounters:  06/18/22 207 lb (93.9 kg)  02/15/22 202 lb (91.6 kg)  08/18/21 204 lb 6.4 oz (92.7 kg)    Physical Exam Vitals and nursing note reviewed.  Constitutional:      Appearance: Normal appearance. He is well-developed.  HENT:     Head: Normocephalic and atraumatic.     Nose: Nose normal.     Mouth/Throat:     Mouth: Mucous membranes are moist.     Pharynx: Oropharynx is clear.  Eyes:     Extraocular Movements: Extraocular movements intact.     Conjunctiva/sclera: Conjunctivae normal.     Pupils: Pupils are equal, round, and reactive to light.  Cardiovascular:     Rate and Rhythm: Normal rate and regular rhythm.     Pulses: Normal pulses.     Heart sounds: Normal heart sounds.  Pulmonary:     Effort: Pulmonary effort is normal.     Breath sounds: Normal breath sounds.  Abdominal:     Palpations: Abdomen is soft.  Musculoskeletal:        General: Normal range of motion.     Cervical back: Normal range of motion and neck supple.  Lymphadenopathy:     Cervical: No cervical adenopathy.  Skin:    General: Skin is warm and dry.     Capillary Refill: Capillary refill takes less than 2 seconds.  Neurological:     General: No focal deficit present.     Mental Status: He is alert and oriented to person, place, and time.  Psychiatric:        Attention and Perception: Attention and perception normal.         Mood and Affect: Mood is depressed.        Speech: Speech normal.        Behavior: Behavior normal. Behavior is cooperative.        Thought Content: Thought content normal.        Cognition and Memory: Cognition and memory normal.        Judgment: Judgment normal.      Assessment & Plan    1. Primary hypertension Blood pressure stable. Continue medication as prescribed. Refills sent to pharmacy today  - amLODipine-valsartan (EXFORGE) 10-320 MG tablet; Take 1 tablet by mouth daily.  Dispense:  90 tablet; Refill: 1 - hydrochlorothiazide (MICROZIDE) 12.5 MG capsule; Take 1 capsule (12.5 mg total) by mouth daily.  Dispense: 90 capsule; Refill: 1  2. Mixed hyperlipidemia Mild elevation of LDL and total cholesterol with recent labs. Continue atorvastatin as prescribed.  Recommend patient limit intake of fried and fatty foods. He should increase intake of lean proteins and green leafy vegetables. Adding exercise into daily routine will also be beneficial.   - atorvastatin (LIPITOR) 80 MG tablet; Take 1 tablet (80 mg total) by mouth daily. TAKE 1 TABLET BY MOUTH EVERY NIGHT AT BEDTIME  Dispense: 90 tablet; Refill: 1  3. Mood disorder (HCC) Stable. Continue lamictal as prescribed. Refills provided today  - lamoTRIgine (LAMICTAL) 100 MG tablet; TAKE 1/2 TABLET(50 MG) BY MOUTH TWICE DAILY  Dispense: 90 tablet; Refill: 1  4. Generalized osteoarthritis of multiple sites May take meloxicam daily as needed - meloxicam (MOBIC) 15 MG tablet; TAKE 1 TABLET(15 MG) BY MOUTH DAILY  Dispense: 90 tablet; Refill: 1  5. Adjustment disorder with mixed anxiety and depressed mood Continue sertraline as prescribed  - sertraline (ZOLOFT) 100 MG tablet; Take  tablet po QD  Dispense: 90 tablet; Refill: 1   Problem List Items Addressed This Visit       Cardiovascular and Mediastinum   HTN (hypertension) (Chronic)   Relevant Medications   amLODipine-valsartan (EXFORGE) 10-320 MG tablet   atorvastatin (LIPITOR)  80 MG tablet   hydrochlorothiazide (MICROZIDE) 12.5 MG capsule     Musculoskeletal and Integument   Generalized osteoarthritis of multiple sites   Relevant Medications   meloxicam (MOBIC) 15 MG tablet     Other   Mood disorder (HCC) (Chronic)   Relevant Medications   lamoTRIgine (LAMICTAL) 100 MG tablet   HLD (hyperlipidemia) (Chronic)   Relevant Medications   amLODipine-valsartan (EXFORGE) 10-320 MG tablet   atorvastatin (LIPITOR) 80 MG tablet   hydrochlorothiazide (MICROZIDE) 12.5 MG capsule   Adjustment disorder with mixed anxiety and depressed mood (Chronic)   Relevant Medications   sertraline (ZOLOFT) 100 MG tablet     Return in about 4 months (around 10/17/2022) for mood, health maintenance exam.         Ronnell Freshwater, NP  [place] [facility] [phone] (phone) [phone] (fax)  [place]

## 2022-10-17 ENCOUNTER — Ambulatory Visit (INDEPENDENT_AMBULATORY_CARE_PROVIDER_SITE_OTHER): Payer: BC Managed Care – PPO | Admitting: Family Medicine

## 2022-10-17 ENCOUNTER — Encounter: Payer: Self-pay | Admitting: Family Medicine

## 2022-10-17 VITALS — BP 150/84 | HR 67 | Resp 18 | Ht 71.0 in | Wt 217.0 lb

## 2022-10-17 DIAGNOSIS — F39 Unspecified mood [affective] disorder: Secondary | ICD-10-CM | POA: Diagnosis not present

## 2022-10-17 DIAGNOSIS — Z716 Tobacco abuse counseling: Secondary | ICD-10-CM

## 2022-10-17 DIAGNOSIS — E782 Mixed hyperlipidemia: Secondary | ICD-10-CM | POA: Diagnosis not present

## 2022-10-17 DIAGNOSIS — R21 Rash and other nonspecific skin eruption: Secondary | ICD-10-CM

## 2022-10-17 DIAGNOSIS — I10 Essential (primary) hypertension: Secondary | ICD-10-CM | POA: Diagnosis not present

## 2022-10-17 MED ORDER — AMLODIPINE BESYLATE-VALSARTAN 10-320 MG PO TABS
1.0000 | ORAL_TABLET | Freq: Every day | ORAL | 1 refills | Status: DC
Start: 2022-10-17 — End: 2022-11-28

## 2022-10-17 MED ORDER — TRIAMCINOLONE ACETONIDE 0.1 % EX OINT
1.0000 | TOPICAL_OINTMENT | Freq: Two times a day (BID) | CUTANEOUS | 1 refills | Status: AC
Start: 2022-10-17 — End: ?

## 2022-10-17 MED ORDER — HYDROCHLOROTHIAZIDE 25 MG PO TABS
25.0000 mg | ORAL_TABLET | Freq: Every day | ORAL | 3 refills | Status: DC
Start: 2022-10-17 — End: 2022-11-28

## 2022-10-17 MED ORDER — LAMOTRIGINE 100 MG PO TABS
ORAL_TABLET | ORAL | 1 refills | Status: DC
Start: 2022-10-17 — End: 2022-11-28

## 2022-10-17 MED ORDER — ATORVASTATIN CALCIUM 80 MG PO TABS
80.0000 mg | ORAL_TABLET | Freq: Every day | ORAL | 1 refills | Status: DC
Start: 2022-10-17 — End: 2022-11-28

## 2022-10-17 MED ORDER — SERTRALINE HCL 100 MG PO TABS
150.0000 mg | ORAL_TABLET | Freq: Every day | ORAL | 1 refills | Status: DC
Start: 2022-10-17 — End: 2022-11-28

## 2022-10-17 NOTE — Patient Instructions (Signed)
Send me a message if you would like to be connected with resources that can help you with the transition period if you need it!

## 2022-10-17 NOTE — Assessment & Plan Note (Signed)
BP goal <130/80, above goal initially and on repeat.  Increase hydrochlorothiazide to 25 mg daily, continue amlodipine-valsartan 10-320 mg daily.  Continue ambulatory blood pressure monitoring is much as possible, limit salt intake.  We discussed that smoking cessation would also be beneficial.  Patient is working on it with his wife but admits that it has not been going as well as he would like it to with the recent stress of losing his job.

## 2022-10-17 NOTE — Addendum Note (Signed)
Addended by: Saralyn Pilar on: 10/17/2022 10:12 AM   Modules accepted: Orders

## 2022-10-17 NOTE — Assessment & Plan Note (Signed)
PHQ-9 score of 3, GAD-7 score of 9.  Patient has noticed that his temper has increased and he feels more anxious because of his job situation.  Increase Zoloft to 150 mg daily, continue Lamictal 100 mg daily.  We will follow-up in about 6 weeks to assess efficacy and see if he has any updates about his job/insurance.

## 2022-10-17 NOTE — Progress Notes (Signed)
Established Patient Office Visit  Subjective   Patient ID: Aaron Cooper, male    DOB: [birth year]  Age: 57 y.o. MRN: [id number]  Chief Complaint  Patient presents with   Anxiety   Depression   Mass    Right wrist     HPI Aaron Cooper is a 57 y.o. male presenting today for follow up of mood.  He also has a concern today about a spot over his right wrist that developed within the past couple of months.  The spot is not itchy or painful.  He typically wears his watch on his left wrist and denies wearing anything on his right.  He does not recall any injury to the area. Mood: Patient is here to follow up for anxiety and depression, currently managing with Zoloft and Lamictal. Taking medication without side effects, reports excellent compliance with treatment. Denies mood changes or SI/HI. He feels mood is worse since last visit.  He has been experiencing increase in stress as he recently found out that he will be losing his job at the end of September.  With that, he is losing health insurance for himself and his wife.     10/17/2022    8:27 AM 06/18/2022    8:49 AM 02/15/2022   10:34 AM  Depression screen PHQ 2/9  Decreased Interest 0 1 1  Down, Depressed, Hopeless 1 1 0  PHQ - 2 Score 1 2 1   Altered sleeping 0 1 0  Tired, decreased energy 0 1 0  Change in appetite 1 1 0  Feeling bad or failure about yourself  1 1 0  Trouble concentrating 0 1 1  Moving slowly or fidgety/restless 0 0 0  Suicidal thoughts 0 0 0  PHQ-9 Score 3 7 2   Difficult doing work/chores Not difficult at all         10/17/2022    8:28 AM 06/18/2022    8:49 AM 02/15/2022   10:34 AM 08/18/2021    9:40 AM  GAD 7 : Generalized Anxiety Score  Nervous, Anxious, on Edge 0 1 0 0  Control/stop worrying 1 2 0 0  Worry too much - different things 1 1 0 0  Trouble relaxing 2 1 0 0  Restless 1 0 0 0  Easily annoyed or irritable 2 2 1  0  Afraid - awful might happen 2 2 0 0  Total GAD 7 Score 9 9 1  0  Anxiety  Difficulty Somewhat difficult      ROS Negative unless otherwise noted in HPI   Objective:     BP (!) 150/84 (BP Location: Left Arm, Patient Position: Sitting, Cuff Size: Normal)   Pulse 67   Resp 18   Ht 5\' 11"  (1.803 m)   Wt 217 lb (98.4 kg)   SpO2 96%   BMI 30.27 kg/m   Physical Exam Constitutional:      General: He is not in acute distress.    Appearance: Normal appearance.  HENT:     Head: Normocephalic and atraumatic.  Cardiovascular:     Rate and Rhythm: Normal rate and regular rhythm.     Pulses: Normal pulses.     Heart sounds: Normal heart sounds. No murmur heard.    No friction rub. No gallop.  Pulmonary:     Effort: Pulmonary effort is normal.     Breath sounds: Normal breath sounds. No wheezing, rhonchi or rales.  Skin:    General: Skin is warm and dry.  Findings: Rash (Flat macular papular 3 cm x 2 cm over ulnar styloid process) present.  Neurological:     Mental Status: He is alert and oriented to person, place, and time.  Psychiatric:        Mood and Affect: Mood normal.     Assessment & Plan:  Primary hypertension Assessment & Plan: BP goal <130/80, above goal initially and on repeat.  Increase hydrochlorothiazide to 25 mg daily, continue amlodipine-valsartan 10-320 mg daily.  Continue ambulatory blood pressure monitoring is much as possible, limit salt intake.  We discussed that smoking cessation would also be beneficial.  Patient is working on it with his wife but admits that it has not been going as well as he would like it to with the recent stress of losing his job.   Orders: -     amLODIPine Besylate-Valsartan; Take 1 tablet by mouth daily.  Dispense: 90 tablet; Refill: 1 -     hydroCHLOROthiazide; Take 1 tablet (25 mg total) by mouth daily.  Dispense: 90 tablet; Refill: 3  Mood disorder (HCC) Assessment & Plan: PHQ-9 score of 3, GAD-7 score of 9.  Patient has noticed that his temper has increased and he feels more anxious because of his  job situation.  Increase Zoloft to 150 mg daily, continue Lamictal 100 mg daily.  We will follow-up in about 6 weeks to assess efficacy and see if he has any updates about his job/insurance.  Orders: -     Sertraline HCl; Take 1.5 tablets (150 mg total) by mouth daily. Take  tablet po QD  Dispense: 135 tablet; Refill: 1 -     lamoTRIgine; TAKE 1/2 TABLET(50 MG) BY MOUTH TWICE DAILY  Dispense: 90 tablet; Refill: 1  Mixed hyperlipidemia -     Atorvastatin Calcium; Take 1 tablet (80 mg total) by mouth daily. TAKE 1 TABLET BY MOUTH EVERY NIGHT AT BEDTIME  Dispense: 90 tablet; Refill: 1  Encounter for tobacco use cessation counseling  I congratulated him on making that decision that he would like to stop smoking.  I encouraged him to continue with his efforts for smoking cessation, we discussed that there are also other support resources available including therapy, patches, pills, gum, etc.  Patient verbalized understanding and will let me know if he is interested in any of these options in the future.  Trial of triamcinolone cream for the spot on his wrist.  We discussed that to know exactly what it is, he would need to go to dermatology.  Patient would not like to do this at this time.  We also discussed potentially getting him in contact with the [facility] care coordinators who can share resources to help them through the transition.  If he does find that he is unemployed and/or does not have health insurance.  Patient does not feel that this is necessary right now, but he will send me a message on MyChart if he changes his mind.  Return in about 6 weeks (around 11/28/2022) for follow-up for Zoloft increase.    Melida Quitter, PA

## 2022-11-14 NOTE — Progress Notes (Signed)
Vascular exam recommended, specifically carotid doppler.

## 2022-11-28 ENCOUNTER — Encounter: Payer: Self-pay | Admitting: Family Medicine

## 2022-11-28 ENCOUNTER — Ambulatory Visit (INDEPENDENT_AMBULATORY_CARE_PROVIDER_SITE_OTHER): Payer: BC Managed Care – PPO | Admitting: Family Medicine

## 2022-11-28 VITALS — BP 105/63 | HR 63 | Temp 97.5°F | Ht 71.0 in | Wt 210.5 lb

## 2022-11-28 DIAGNOSIS — F39 Unspecified mood [affective] disorder: Secondary | ICD-10-CM | POA: Diagnosis not present

## 2022-11-28 DIAGNOSIS — I10 Essential (primary) hypertension: Secondary | ICD-10-CM

## 2022-11-28 DIAGNOSIS — E782 Mixed hyperlipidemia: Secondary | ICD-10-CM | POA: Diagnosis not present

## 2022-11-28 MED ORDER — AMLODIPINE BESYLATE-VALSARTAN 10-320 MG PO TABS
1.0000 | ORAL_TABLET | Freq: Every day | ORAL | 1 refills | Status: DC
Start: 2022-11-28 — End: 2023-04-01

## 2022-11-28 MED ORDER — LAMOTRIGINE 100 MG PO TABS
ORAL_TABLET | ORAL | 1 refills | Status: DC
Start: 2022-11-28 — End: 2023-04-01

## 2022-11-28 MED ORDER — HYDROCHLOROTHIAZIDE 25 MG PO TABS
25.0000 mg | ORAL_TABLET | Freq: Every day | ORAL | 1 refills | Status: DC
Start: 2022-11-28 — End: 2023-04-01

## 2022-11-28 MED ORDER — ATORVASTATIN CALCIUM 80 MG PO TABS
80.0000 mg | ORAL_TABLET | Freq: Every day | ORAL | 1 refills | Status: DC
Start: 2022-11-28 — End: 2023-04-01

## 2022-11-28 MED ORDER — SERTRALINE HCL 100 MG PO TABS
150.0000 mg | ORAL_TABLET | Freq: Every day | ORAL | 1 refills | Status: DC
Start: 2022-11-28 — End: 2023-04-01

## 2022-11-28 NOTE — Patient Instructions (Addendum)
I added some information about the medicine that we can add on for additional help with anxiety in particular. It is called buspirone.  The prescription assistance is called [employer], I will list their website below. You can also Google "[employer]" and it should be the first link.   There are also programs from individual manufacturers for some name-brand medications. You can look into that for you and your wife. In the meantime, I will try to send in a 6 months supply of your medications so that you do not run out.  [email or web address]

## 2022-11-28 NOTE — Assessment & Plan Note (Signed)
BP goal <130/80, at goal.  Continue hydrochlorothiazide 25 mg daily, continue amlodipine-valsartan 10-320 mg daily.  Continue ambulatory blood pressure monitoring is much as possible, limit salt intake.  Will continue to monitor.

## 2022-11-28 NOTE — Progress Notes (Signed)
Established Patient Office Visit  Subjective   Patient ID: Aaron Cooper, male    DOB: 03-Apr-1966  Age: 57 y.o. MRN: [id number]  Chief Complaint  Patient presents with   Medication Management    HPI Aaron Cooper is a 57 y.o. male presenting today for follow up of mood. Mood: Patient is here to follow up for depression and anxiety, currently managing with Zoloft 150 mg daily, lamotrigine 50 mg daily. Taking medication without side effects, reports excellent compliance with treatment. Denies mood changes or SI/HI. He feels mood is fairly stable since last visit but still not great.  He has learned that his final day at his job will be September 6, and as that day gets closer his stress continues to increase.  He has not found another job yet and is worried about losing health insurance for himself and his wife.  He does not know if further adjusting medications will do anything to alleviate those feelings that continue to grow.     11/28/2022    9:51 AM 10/17/2022    8:27 AM 06/18/2022    8:49 AM  Depression screen PHQ 2/9  Decreased Interest 1 0 1  Down, Depressed, Hopeless 1 1 1   PHQ - 2 Score 2 1 2   Altered sleeping 1 0 1  Tired, decreased energy 0 0 1  Change in appetite 0 1 1  Feeling bad or failure about yourself  1 1 1   Trouble concentrating 0 0 1  Moving slowly or fidgety/restless 0 0 0  Suicidal thoughts 0 0 0  PHQ-9 Score 4 3 7   Difficult doing work/chores Somewhat difficult Not difficult at all        11/28/2022    9:52 AM 10/17/2022    8:28 AM 06/18/2022    8:49 AM 02/15/2022   10:34 AM  GAD 7 : Generalized Anxiety Score  Nervous, Anxious, on Edge 1 0 1 0  Control/stop worrying 1 1 2  0  Worry too much - different things 2 1 1  0  Trouble relaxing 1 2 1  0  Restless 0 1 0 0  Easily annoyed or irritable 1 2 2 1   Afraid - awful might happen 1 2 2  0  Total GAD 7 Score 7 9 9 1   Anxiety Difficulty Somewhat difficult Somewhat difficult     ROS Negative unless  otherwise noted in HPI   Objective:     BP 105/63   Pulse 63   Temp (!) 97.5 F (36.4 C) (Oral)   Ht 5\' 11"  (1.803 m)   Wt 210 lb 8 oz (95.5 kg)   SpO2 95%   BMI 29.36 kg/m   Physical Exam Vitals reviewed.  Constitutional:      General: He is not in acute distress.    Appearance: Normal appearance. He is not ill-appearing.  HENT:     Head: Normocephalic and atraumatic.     Nose: Nose normal.  Eyes:     Conjunctiva/sclera: Conjunctivae normal.  Pulmonary:     Effort: Pulmonary effort is normal.  Musculoskeletal:     Cervical back: Normal range of motion.  Skin:    Coloration: Skin is not jaundiced or pale.     Findings: No bruising.  Neurological:     Mental Status: He is alert and oriented to person, place, and time.  Psychiatric:        Mood and Affect: Mood is anxious.     Assessment & Plan:  Mood disorder (HCC)  Assessment & Plan: PHQ-9 score of 4, GAD-7 score of 7.  Continue Zoloft 150 mg daily, lamotrigine 100 mg daily.  We will follow-up in about 4 months to give him time to figure out his insurance and find another job.  He will let me know if he is not able to make that appointment work and needs some more time.  I provided prescriptions for the next 6 months for his medications to ensure that he does not have any gaps in his care within that timeframe.  Orders: -     lamoTRIgine; TAKE 1/2 TABLET(50 MG) BY MOUTH TWICE DAILY  Dispense: 180 tablet; Refill: 1 -     Sertraline HCl; Take 1.5 tablets (150 mg total) by mouth daily. Take  tablet po QD  Dispense: 135 tablet; Refill: 1  Mixed hyperlipidemia -     Atorvastatin Calcium; Take 1 tablet (80 mg total) by mouth daily.  Dispense: 180 tablet; Refill: 1  Primary hypertension Assessment & Plan: BP goal <130/80, at goal.  Continue hydrochlorothiazide 25 mg daily, continue amlodipine-valsartan 10-320 mg daily.  Continue ambulatory blood pressure monitoring is much as possible, limit salt intake.  Will continue to  monitor.  Orders: -     amLODIPine Besylate-Valsartan; Take 1 tablet by mouth daily.  Dispense: 180 tablet; Refill: 1 -     hydroCHLOROthiazide; Take 1 tablet (25 mg total) by mouth daily.  Dispense: 180 tablet; Refill: 1  Provided information on prescription assistance through [employer] and encouraged him to also look for prescription assistance programs through individual manufacturers.  Return in about 4 months (around 03/31/2023) for follow-up for mood, HTN, HLD.    Aaron Quitter, PA

## 2022-11-28 NOTE — Assessment & Plan Note (Signed)
PHQ-9 score of 4, GAD-7 score of 7.  Continue Zoloft 150 mg daily, lamotrigine 100 mg daily.  We will follow-up in about 4 months to give him time to figure out his insurance and find another job.  He will let me know if he is not able to make that appointment work and needs some more time.  I provided prescriptions for the next 6 months for his medications to ensure that he does not have any gaps in his care within that timeframe.

## 2023-04-01 ENCOUNTER — Encounter: Payer: Self-pay | Admitting: Family Medicine

## 2023-04-01 ENCOUNTER — Ambulatory Visit: Payer: Medicaid Other | Admitting: Family Medicine

## 2023-04-01 VITALS — BP 121/79 | HR 66 | Resp 18 | Ht 71.0 in | Wt 212.0 lb

## 2023-04-01 DIAGNOSIS — F39 Unspecified mood [affective] disorder: Secondary | ICD-10-CM

## 2023-04-01 DIAGNOSIS — E782 Mixed hyperlipidemia: Secondary | ICD-10-CM

## 2023-04-01 DIAGNOSIS — I10 Essential (primary) hypertension: Secondary | ICD-10-CM

## 2023-04-01 DIAGNOSIS — Z1159 Encounter for screening for other viral diseases: Secondary | ICD-10-CM | POA: Diagnosis not present

## 2023-04-01 DIAGNOSIS — Z131 Encounter for screening for diabetes mellitus: Secondary | ICD-10-CM

## 2023-04-01 MED ORDER — AMLODIPINE BESYLATE-VALSARTAN 10-320 MG PO TABS
1.0000 | ORAL_TABLET | Freq: Every day | ORAL | 1 refills | Status: AC
Start: 2023-04-01 — End: ?

## 2023-04-01 MED ORDER — LAMOTRIGINE 100 MG PO TABS
ORAL_TABLET | ORAL | 1 refills | Status: DC
Start: 2023-04-01 — End: 2023-04-22

## 2023-04-01 MED ORDER — SERTRALINE HCL 100 MG PO TABS
200.0000 mg | ORAL_TABLET | Freq: Every day | ORAL | 1 refills | Status: AC
Start: 2023-04-01 — End: ?

## 2023-04-01 MED ORDER — HYDROCHLOROTHIAZIDE 25 MG PO TABS
25.0000 mg | ORAL_TABLET | Freq: Every day | ORAL | 1 refills | Status: AC
Start: 2023-04-01 — End: ?

## 2023-04-01 MED ORDER — ATORVASTATIN CALCIUM 80 MG PO TABS
80.0000 mg | ORAL_TABLET | Freq: Every day | ORAL | 1 refills | Status: DC
Start: 2023-04-01 — End: 2023-04-22

## 2023-04-01 NOTE — Assessment & Plan Note (Signed)
BP goal <130/80, at goal.  Continue hydrochlorothiazide 25 mg daily, continue amlodipine-valsartan 10-320 mg daily.  Continue ambulatory blood pressure monitoring is much as possible, limit salt intake.  Will continue to monitor.

## 2023-04-01 NOTE — Assessment & Plan Note (Signed)
Given recent events with his wife, increase Zoloft to 200 mg daily, continue lamotrigine 100 mg daily.  Encouraged to continue prioritizing mental health, will follow-up in 4 months unless needed sooner.

## 2023-04-01 NOTE — Progress Notes (Signed)
Established Patient Office Visit  Subjective   Patient ID: Aaron Cooper, male    DOB: 01/30/1966  Age: 57 y.o. MRN: [id number]  Chief Complaint  Patient presents with   Anxiety   Depression   Hyperlipidemia   Hypertension    HPI Aaron Cooper is a 57 y.o. male presenting today for follow up of hypertension, hyperlipidemia, mood. Hypertension:  Pt denies chest pain, SOB, dizziness, edema, syncope, fatigue or heart palpitations. Taking hydrochlorothiazide, amlodipine-valsartan, reports excellent compliance with treatment. Denies side effects. Hyperlipidemia: tolerating atorvastatin well with no myalgias or significant side effects.  The 10-year ASCVD risk score (Arnett DK, et al., 2019) is: 23% Mood: Patient is here to follow up for depression and anxiety, currently managing with Zoloft 150 mg daily and lamotrigine. Taking medication without side effects, reports excellent compliance with treatment. Denies mood changes or SI/HI. He feels mood is worse since last visit due to his wife's diagnosis and start of chemotherapy.  He states that Dr. Myna Hidalgo has been fantastic and they are happy with the care that Aaron Cooper has been getting.  He is trying to take things a day at a time and has been dealing the best that he can.  He would like to try a slightly higher dose of Zoloft for the time being.     04/01/2023   10:05 AM 11/28/2022    9:51 AM 10/17/2022    8:27 AM  Depression screen PHQ 2/9  Decreased Interest 1 1 0  Down, Depressed, Hopeless 1 1 1   PHQ - 2 Score 2 2 1   Altered sleeping 1 1 0  Tired, decreased energy 0 0 0  Change in appetite 1 0 1  Feeling bad or failure about yourself  0 1 1  Trouble concentrating 0 0 0  Moving slowly or fidgety/restless 0 0 0  Suicidal thoughts 0 0 0  PHQ-9 Score 4 4 3   Difficult doing work/chores Somewhat difficult Somewhat difficult Not difficult at all       04/01/2023   10:05 AM 11/28/2022    9:52 AM 10/17/2022    8:28 AM 06/18/2022     8:49 AM  GAD 7 : Generalized Anxiety Score  Nervous, Anxious, on Edge 1 1 0 1  Control/stop worrying 1 1 1 2   Worry too much - different things 1 2 1 1   Trouble relaxing 1 1 2 1   Restless 1 0 1 0  Easily annoyed or irritable 1 1 2 2   Afraid - awful might happen 1 1 2 2   Total GAD 7 Score 7 7 9 9   Anxiety Difficulty Not difficult at all Somewhat difficult Somewhat difficult      Outpatient Medications Prior to Visit  Medication Sig   aspirin 325 MG tablet Take 325 mg by mouth daily.   meloxicam (MOBIC) 15 MG tablet TAKE 1 TABLET(15 MG) BY MOUTH DAILY   sildenafil (VIAGRA) 25 MG tablet Take 1 tablet (25 mg total) by mouth daily as needed for erectile dysfunction.   triamcinolone ointment (KENALOG) 0.1 % Apply 1 Application topically 2 (two) times daily.   [DISCONTINUED] amLODipine-valsartan (EXFORGE) 10-320 MG tablet Take 1 tablet by mouth daily.   [DISCONTINUED] atorvastatin (LIPITOR) 80 MG tablet Take 1 tablet (80 mg total) by mouth daily.   [DISCONTINUED] hydrochlorothiazide (HYDRODIURIL) 25 MG tablet Take 1 tablet (25 mg total) by mouth daily.   [DISCONTINUED] lamoTRIgine (LAMICTAL) 100 MG tablet TAKE 1/2 TABLET(50 MG) BY MOUTH TWICE DAILY   [DISCONTINUED] sertraline (  ZOLOFT) 100 MG tablet Take 1.5 tablets (150 mg total) by mouth daily. Take  tablet po QD   No facility-administered medications prior to visit.    ROS Negative unless otherwise noted in HPI   Objective:     BP 121/79 (BP Location: Left Arm, Patient Position: Sitting, Cuff Size: Normal)   Pulse 66   Resp 18   Ht 5\' 11"  (1.803 m)   Wt 212 lb (96.2 kg)   SpO2 98%   BMI 29.57 kg/m   Physical Exam Constitutional:      General: He is not in acute distress.    Appearance: Normal appearance.  HENT:     Head: Normocephalic and atraumatic.  Cardiovascular:     Rate and Rhythm: Normal rate and regular rhythm.     Heart sounds: Normal heart sounds. No murmur heard.    No friction rub. No gallop.  Pulmonary:      Effort: Pulmonary effort is normal. No respiratory distress.     Breath sounds: Normal breath sounds. No wheezing, rhonchi or rales.  Skin:    General: Skin is warm and dry.  Neurological:     Mental Status: He is alert and oriented to person, place, and time.  Psychiatric:        Mood and Affect: Mood normal.     Assessment & Plan:  Primary hypertension Assessment & Plan: BP goal <130/80, at goal.  Continue hydrochlorothiazide 25 mg daily, continue amlodipine-valsartan 10-320 mg daily.  Continue ambulatory blood pressure monitoring is much as possible, limit salt intake.  Will continue to monitor.  Orders: -     amLODIPine Besylate-Valsartan; Take 1 tablet by mouth daily.  Dispense: 180 tablet; Refill: 1 -     hydroCHLOROthiazide; Take 1 tablet (25 mg total) by mouth daily.  Dispense: 180 tablet; Refill: 1 -     CBC with Differential/Platelet; Future -     Comprehensive metabolic panel; Future  Mood disorder [facility]) Assessment & Plan: Given recent events with his wife, increase Zoloft to 200 mg daily, continue lamotrigine 100 mg daily.  Encouraged to continue prioritizing mental health, will follow-up in 4 months unless needed sooner.  Orders: -     lamoTRIgine; TAKE 1/2 TABLET(50 MG) BY MOUTH TWICE DAILY  Dispense: 180 tablet; Refill: 1 -     Sertraline HCl; Take 2 tablets (200 mg total) by mouth daily. Take  tablet po QD  Dispense: 180 tablet; Refill: 1 -     CBC with Differential/Platelet; Future -     Comprehensive metabolic panel; Future -     Lipid panel; Future  Mixed hyperlipidemia Assessment & Plan: Repeating lipid panel today, it has been 1 year since cholesterol levels were checked.  Continue atorvastatin 80 mg daily unless lab results warrant change.  Will continue to monitor.  Orders: -     Atorvastatin Calcium; Take 1 tablet (80 mg total) by mouth daily.  Dispense: 180 tablet; Refill: 1  Screening for viral disease -     Hepatitis C antibody; Future -      HIV Antibody (routine testing w rflx); Future  Screening for diabetes mellitus -     Hemoglobin A1c; Future    Return in about 4 months (around 07/30/2023) for follow-up for mood.    Melida Quitter, PA

## 2023-04-01 NOTE — Assessment & Plan Note (Signed)
Repeating lipid panel today, it has been 1 year since cholesterol levels were checked.  Continue atorvastatin 80 mg daily unless lab results warrant change.  Will continue to monitor.

## 2023-04-02 LAB — COMPREHENSIVE METABOLIC PANEL
ALT: 23 [IU]/L (ref 0–44)
AST: 18 [IU]/L (ref 0–40)
Albumin: 4.4 g/dL (ref 3.8–4.9)
Alkaline Phosphatase: 144 [IU]/L — ABNORMAL HIGH (ref 44–121)
BUN/Creatinine Ratio: 23 — ABNORMAL HIGH (ref 9–20)
BUN: 29 mg/dL — ABNORMAL HIGH (ref 6–24)
Bilirubin Total: <0.2 mg/dL (ref 0.0–1.2)
CO2: 21 mmol/L (ref 20–29)
Calcium: 9.3 mg/dL (ref 8.7–10.2)
Chloride: 101 mmol/L (ref 96–106)
Creatinine, Ser: 1.26 mg/dL (ref 0.76–1.27)
Globulin, Total: 2.8 g/dL (ref 1.5–4.5)
Glucose: 109 mg/dL — ABNORMAL HIGH (ref 70–99)
Potassium: 4.1 mmol/L (ref 3.5–5.2)
Sodium: 137 mmol/L (ref 134–144)
Total Protein: 7.2 g/dL (ref 6.0–8.5)
eGFR: 66 mL/min/{1.73_m2} (ref >59)

## 2023-04-02 LAB — CBC WITH DIFFERENTIAL/PLATELET
Basophils Absolute: 0.1 10*3/uL (ref 0.0–0.2)
Basos: 1 %
EOS (ABSOLUTE): 0.1 10*3/uL (ref 0.0–0.4)
Eos: 2 %
Hematocrit: 45.8 % (ref 37.5–51.0)
Hemoglobin: 15.7 g/dL (ref 13.0–17.7)
Immature Grans (Abs): 0 10*3/uL (ref 0.0–0.1)
Immature Granulocytes: 0 %
Lymphocytes Absolute: 1.5 10*3/uL (ref 0.7–3.1)
Lymphs: 24 %
MCH: 31.7 pg (ref 26.6–33.0)
MCHC: 34.3 g/dL (ref 31.5–35.7)
MCV: 93 fL (ref 79–97)
Monocytes Absolute: 0.5 10*3/uL (ref 0.1–0.9)
Monocytes: 8 %
Neutrophils Absolute: 4 10*3/uL (ref 1.4–7.0)
Neutrophils: 65 %
Platelets: 244 10*3/uL (ref 150–450)
RBC: 4.95 x10E6/uL (ref 4.14–5.80)
RDW: 12.9 % (ref 11.6–15.4)
WBC: 6.3 10*3/uL (ref 3.4–10.8)

## 2023-04-02 LAB — LIPID PANEL
Chol/HDL Ratio: 8.5 {ratio} — ABNORMAL HIGH (ref 0.0–5.0)
Cholesterol, Total: 255 mg/dL — ABNORMAL HIGH (ref 100–199)
HDL: 30 mg/dL — ABNORMAL LOW (ref >39)
LDL Chol Calc (NIH): 172 mg/dL — ABNORMAL HIGH (ref 0–99)
Triglycerides: 279 mg/dL — ABNORMAL HIGH (ref 0–149)
VLDL Cholesterol Cal: 53 mg/dL — ABNORMAL HIGH (ref 5–40)

## 2023-04-02 LAB — HIV ANTIBODY (ROUTINE TESTING W REFLEX): HIV Screen 4th Generation wRfx: NONREACTIVE

## 2023-04-02 LAB — HEPATITIS C ANTIBODY: Hep C Virus Ab: NONREACTIVE

## 2023-04-02 LAB — HEMOGLOBIN A1C
Est. average glucose Bld gHb Est-mCnc: 120 mg/dL
Hgb A1c MFr Bld: 5.8 % — ABNORMAL HIGH (ref 4.8–5.6)

## 2023-04-22 ENCOUNTER — Other Ambulatory Visit: Payer: Self-pay | Admitting: Family Medicine

## 2023-04-22 DIAGNOSIS — F39 Unspecified mood [affective] disorder: Secondary | ICD-10-CM

## 2023-04-22 DIAGNOSIS — E782 Mixed hyperlipidemia: Secondary | ICD-10-CM

## 2023-06-13 ENCOUNTER — Encounter: Payer: Self-pay | Admitting: Family Medicine

## 2023-07-31 ENCOUNTER — Ambulatory Visit: Payer: Medicaid Other | Admitting: Family Medicine

## 2024-05-07 DEATH — deceased
